# Patient Record
Sex: Female | Born: 1970 | Race: White | Hispanic: No | Marital: Married | Smoking: Never smoker
Health system: Southern US, Community
[De-identification: ages and names within clinical notes are randomized; demographics above are authoritative.]

---

## 2014-04-08 DIAGNOSIS — IMO0001 Reserved for inherently not codable concepts without codable children: Secondary | ICD-10-CM | POA: Insufficient documentation

## 2017-09-17 ENCOUNTER — Ambulatory Visit (INDEPENDENT_AMBULATORY_CARE_PROVIDER_SITE_OTHER): Payer: BC Managed Care – PPO | Admitting: Sports Medicine

## 2017-09-17 VITALS — BP 118/86 | Ht 64.0 in | Wt 154.0 lb

## 2017-09-17 DIAGNOSIS — S76011A Strain of muscle, fascia and tendon of right hip, initial encounter: Secondary | ICD-10-CM

## 2017-09-17 NOTE — Progress Notes (Signed)
   Subjective:    Patient ID: Bonnie Flores, female    DOB: [birth year], 46 y.o.   MRN: [id number]  HPI chief complaint: Right hip pain  Very pleasant 46 year old female comes in today complaining of 2 weeks of lateral right hip pain. Pain began while she was training for and running a half marathon. She had a general feeling of achiness just prior to having an acute episode of hip pain while running her half marathon. She was able to finish despite her pain. She describes a pulling sensation posterior and lateral to the greater trochanter. It is most noticeable when running up hill. It is less noticeable when running on flat surfaces or down hill. She denies groin pain. No radiating pain. No numbness or tingling. She did have a similar episode several years ago which resolved spontaneously. No prior hip surgeries. She has been using heat.  Past medical history reviewed Medications reviewed Allergies reviewed    Review of Systems    as above Objective:   Physical Exam  Well-developed, well-nourished. No acute distress. Awake alert and oriented 3. Vital signs reviewed  Right hip: Smooth painless hip range of motion with a negative logroll. She is tender to palpation at the insertion of the gluteus medius tendon. Some tenderness to palpation along the gluteus medius muscle itself. No tenderness at the piriformis. No tenderness over the greater trochanter. Negative straight leg raise. Positive Trendelenburg and noticeable hip abductor weakness when compared to the uninvolved left hip. Neurovascularly intact distally. Walking without a limp.      Assessment & Plan:   Right hip pain secondary to gluteus medius tendinopathy/tendinitis  Patient has noticeable hip weakness on exam. We will give her a home exercise program consisting of hip abductor strengthening exercises and pelvic stabilizer exercises. I think she is okay to continue training and exercising using pain as her guide. I did explain to  her that these types of injuries can take several weeks before resolving and the more she pushes through the pain the longer it will take to resolve. Fortunately, she has already completed her half marathon. Follow-up with me in 6 weeks. I did discuss the possibility of formal physical therapy if symptoms persist. Call with questions or concerns prior to her follow-up visit.

## 2018-08-14 ENCOUNTER — Encounter (INDEPENDENT_AMBULATORY_CARE_PROVIDER_SITE_OTHER): Payer: Self-pay | Admitting: Family Medicine

## 2018-08-14 ENCOUNTER — Ambulatory Visit (INDEPENDENT_AMBULATORY_CARE_PROVIDER_SITE_OTHER): Payer: Self-pay

## 2018-08-14 ENCOUNTER — Ambulatory Visit (INDEPENDENT_AMBULATORY_CARE_PROVIDER_SITE_OTHER): Payer: BC Managed Care – PPO | Admitting: Family Medicine

## 2018-08-14 DIAGNOSIS — M25572 Pain in left ankle and joints of left foot: Secondary | ICD-10-CM | POA: Diagnosis not present

## 2018-08-14 NOTE — Progress Notes (Signed)
   Office Visit Note   Patient: Bonnie Flores           Date of Birth: 08/07/1971           MRN: [id number] Visit Date: 08/14/2018 Requested by: Teena IraniSpencer, Sara C, PA-C [address] [place], [place] [id number] PCP: Lucila MaineSpencer, Sara C, PA-C  Subjective: Chief Complaint  Patient presents with  . Left Foot - Pain    Pain x 2-3 weeks.  No known injury, but has been training for a marathon.    HPI: She is a 47 year old seen at the request of Watt ClimesMike Wright, DPT, for left foot pain.  She is training for a marathon.  About 3 weeks ago during a 16 mile run, she started feeling medial foot pain at about mild 11.  She walked/jogged for the last 5 miles but continued to have pain.  She has had pain since then which has gotten progressively more constant.  She has tried taping her foot, using topical ointments, and she wears shoes with arch supports.  Her physical therapist is concerned about possible stress fracture.  She has never had one before.  She has had a history of plantar fasciitis in the past, no other foot problems.  She is otherwise in excellent health.              ROS: All other systems were negative.  Objective: Vital Signs: There were no vitals taken for this visit.  Physical Exam:  Left foot: She has bilateral normal longitudinal arches nonweightbearing but hyperpronation with weightbearing.  There is slight swelling and extreme tenderness at the left foot navicular.  She has pain with supination of the foot against resistance as well.  Tibialis posterior tendon function is intact.  No tenderness to palpation of the tibialis anterior.  No pain with flexion of the great toe against resistance.  Imaging: Three-view x-rays left foot: There is a possible osteophyte at the navicular.  I do not see a definite stress fracture.  No significant degenerative changes.  MSK-US: The tibialis posterior tendon is swollen and hypoechoic, question partial tearing near the insertion on the  navicular.   Assessment & Plan: 1.  Left foot pain, concerning for navicular stress fracture versus partial tear of the tibialis posterior tendon -MRI to further evaluate.   Follow-Up Instructions: No follow-ups on file.       Procedures: None today.   PMFS History: Patient Active Problem List   Diagnosis Date Noted  . Well adult 04/08/2014   History reviewed. No pertinent past medical history.  History reviewed. No pertinent family history.  History reviewed. No pertinent surgical history. Social History   Occupational History  . Not on file  Tobacco Use  . Smoking status: Not on file  Substance and Sexual Activity  . Alcohol use: Not on file  . Drug use: Not on file  . Sexual activity: Not on file

## 2018-08-17 ENCOUNTER — Ambulatory Visit
Admission: RE | Admit: 2018-08-17 | Discharge: 2018-08-17 | Disposition: A | Discharge status: Home / Self Care | Payer: BC Managed Care – PPO | Source: Ambulatory Visit | Attending: Family Medicine | Admitting: Family Medicine

## 2018-08-17 DIAGNOSIS — M25572 Pain in left ankle and joints of left foot: Secondary | ICD-10-CM

## 2018-08-18 ENCOUNTER — Telehealth (INDEPENDENT_AMBULATORY_CARE_PROVIDER_SITE_OTHER): Payer: Self-pay | Admitting: Family Medicine

## 2018-08-18 NOTE — Telephone Encounter (Signed)
MRI scan does not show a stress fracture, which is good news.  However, the tibialis posterior tendon is significantly inflamed and is partially torn as suspected by ultrasound imaging.  With rest and time, this will probably heal on its own.  Cortisone injection can calm down pain and inflammation, but can temporarily increased risk of rupturing the tendon so is not a good idea if she plans to continue running.  Could contemplate PRP (platelet rich plasma) injection to promote healing, but this is not covered by insurance and healing would still take 3 to 6 weeks with this.

## 2018-08-18 NOTE — Telephone Encounter (Signed)
Called and advised patient of message below per Dr. Prince RomeHilts.  Patient has an appointment for 08/19/2018 to discuss MRI.

## 2018-08-19 ENCOUNTER — Ambulatory Visit (INDEPENDENT_AMBULATORY_CARE_PROVIDER_SITE_OTHER): Payer: BC Managed Care – PPO | Admitting: Family Medicine

## 2018-08-19 ENCOUNTER — Encounter (INDEPENDENT_AMBULATORY_CARE_PROVIDER_SITE_OTHER): Payer: Self-pay | Admitting: Family Medicine

## 2018-08-19 DIAGNOSIS — M25572 Pain in left ankle and joints of left foot: Secondary | ICD-10-CM

## 2018-08-19 NOTE — Progress Notes (Signed)
   Office Visit Note   Patient: Bonnie Flores           Date of Birth: [birth year]           MRN: [id number] Visit Date: 08/19/2018 Requested by: Teena IraniSpencer, Sara C, PA-C [address] [place], [place] [id number] PCP: Lucila MaineSpencer, Sara C, PA-C  Subjective: Chief Complaint  Patient presents with  . Left Foot - Pain    HPI: She is here for follow-up left foot pain, to discuss MRI results.  Since last visit she has been resting and her pain is much better.  She is crosstraining, not running.  Her marathon is scheduled in November.              ROS: Otherwise noncontributory  Objective: Vital Signs: There were no vitals taken for this visit.  Physical Exam:  Left foot: No swelling or bruising, still tender to palpation near the navicular.  Imaging: MRI reviewed in detail showing tendinopathy/partial tearing of the tibialis posterior tendon but no sign of stress fracture.  Assessment & Plan: 1.  Left foot pain due to tibialis posterior tendinopathy and partial tearing -Discussed treatment options, she will continue crosstraining for 1 more week and then gradually resume running using pain as guide. -Referral back to physical therapy for consideration of iontophoresis. -If symptoms worsen we could contemplate a cortisone injection but we will try to avoid that for now.  PRP would be another future consideration.    Follow-Up Instructions: No follow-ups on file.       Procedures: None today.   PMFS History: Patient Active Problem List   Diagnosis Date Noted  . Well adult 04/08/2014   History reviewed. No pertinent past medical history.  History reviewed. No pertinent family history.  History reviewed. No pertinent surgical history. Social History   Occupational History  . Not on file  Tobacco Use  . Smoking status: Not on file  Substance and Sexual Activity  . Alcohol use: Not on file  . Drug use: Not on file  . Sexual activity: Not on file

## 2019-09-17 IMAGING — MR MR FOOT*L* W/O CM
4 of 5 series · 17 of 40 positions shown · non-contrast
Comparison: Radiographs dated 08/14/2018

CLINICAL DATA: Left hindfoot pain for 3 weeks starting mild
running.

EXAM:
MRI OF THE LEFT FOOT WITHOUT CONTRAST
TECHNIQUE: Multiplanar, multisequence MR imaging of the ankle was performed. No
intravenous contrast was administered.

[Series 3: T2 fat-sat · axial · 3.0mm · 0.50mm/px · z∈[-70,+33]mm · 3 of 34 slices shown (1 of 2)]
[im 4/34]
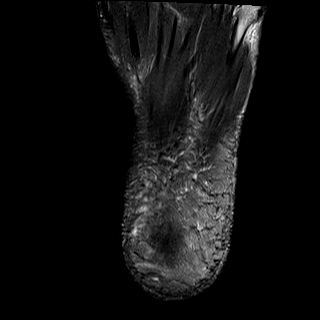
[im 19/34]
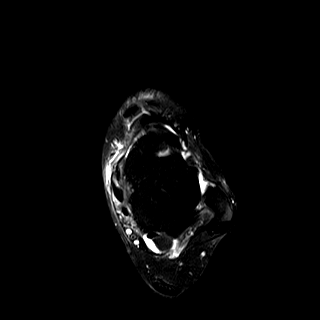
[im 30/34]
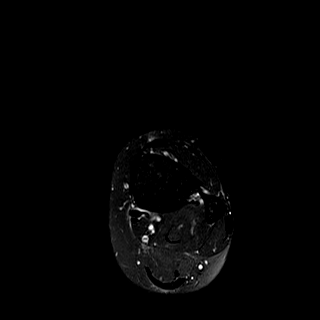

[Series 4: PD fat-sat · axial · 3.0mm · 0.31mm/px · z∈[-82,+29]mm · 8 of 34 slices shown]
[im 1/34]
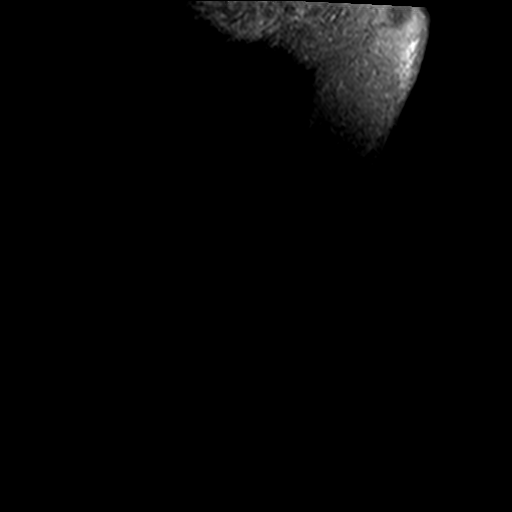
[im 5/34]
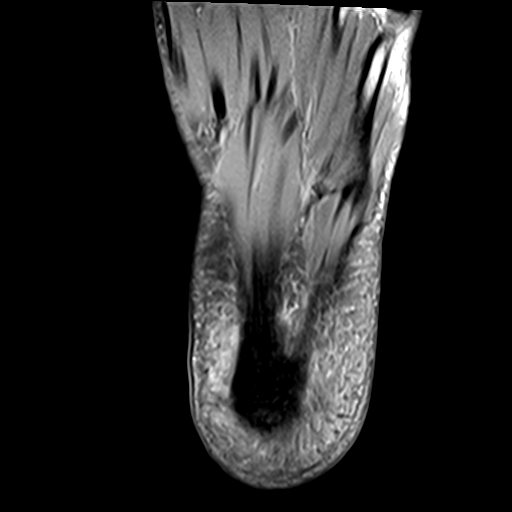
[im 9/34]
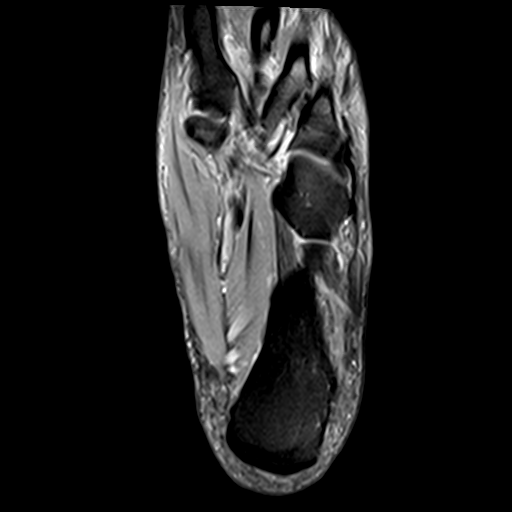
[im 13/34]
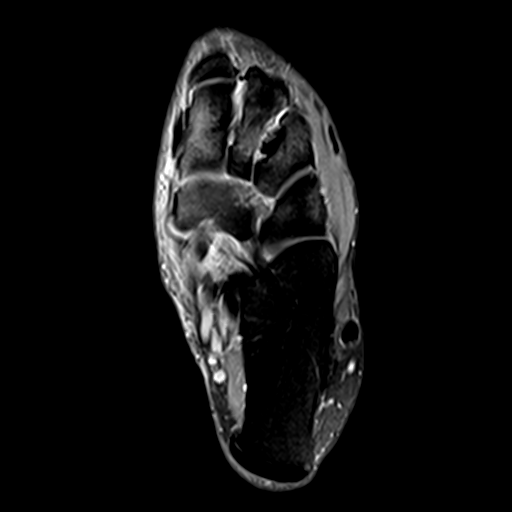
[im 17/34]
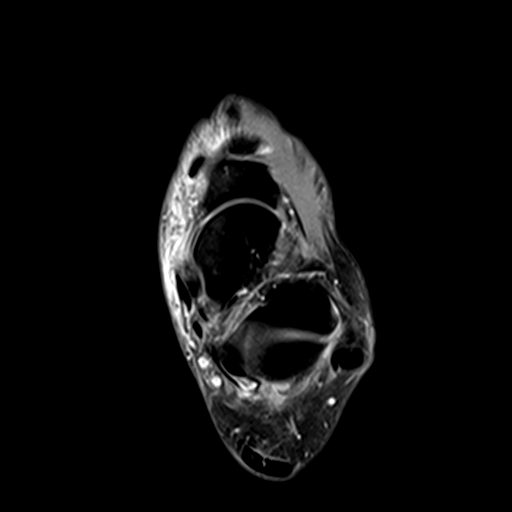
[im 21/34]
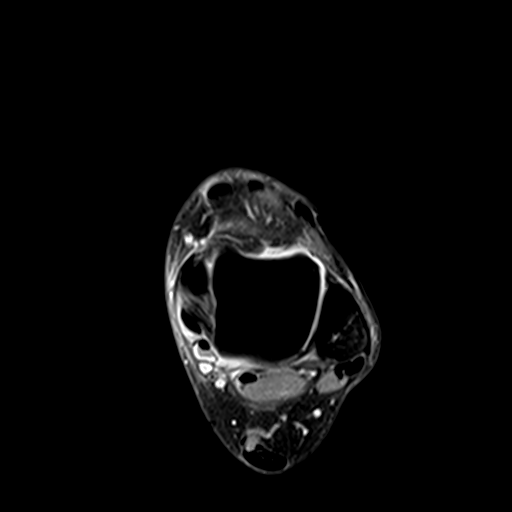
[im 25/34]
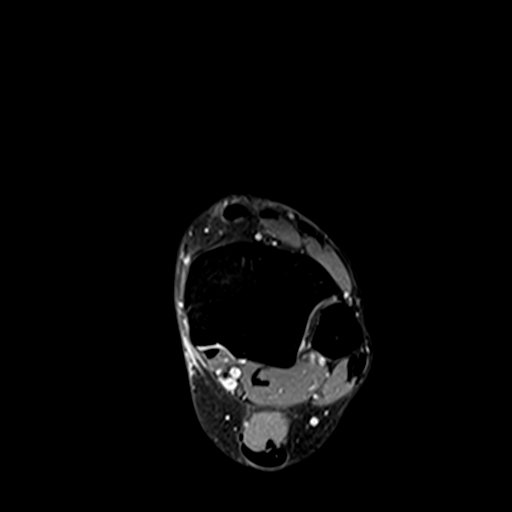
[im 29/34]
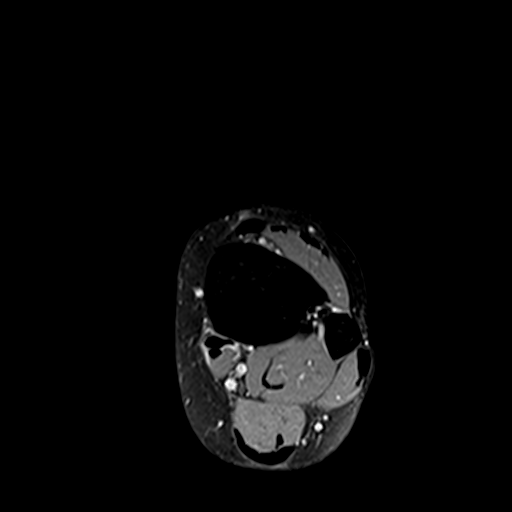

[Series 5: T1 · sagittal · 4.0mm · 0.21mm/px · 3 of 19 slices shown]
[im 1/19]
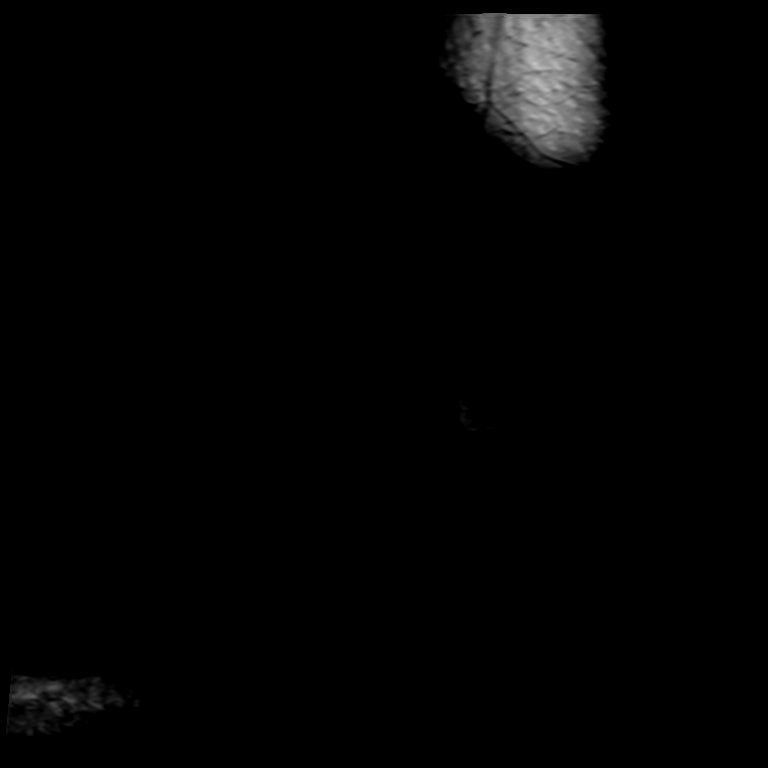
[im 10/19]
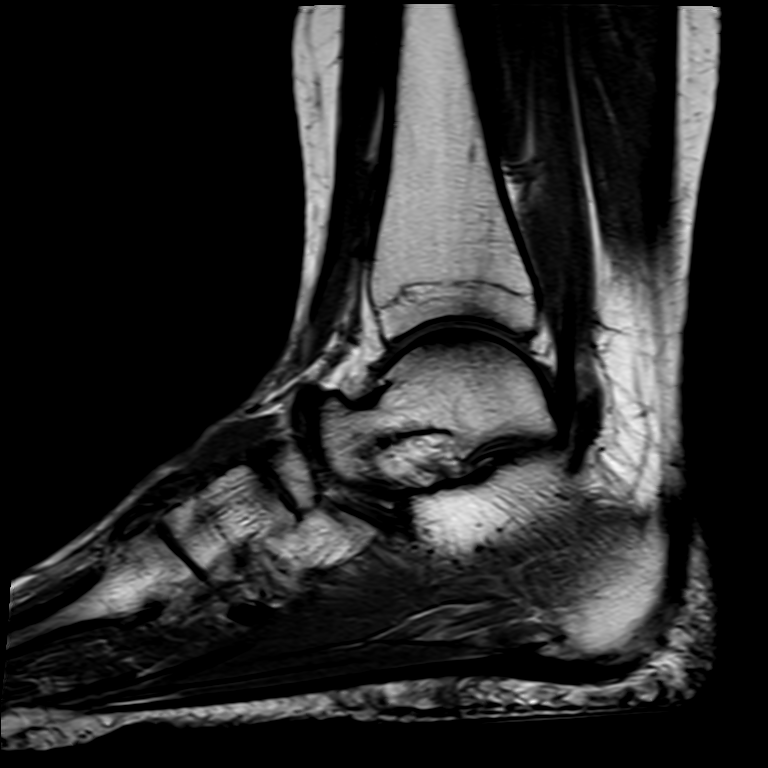
[im 19/19]
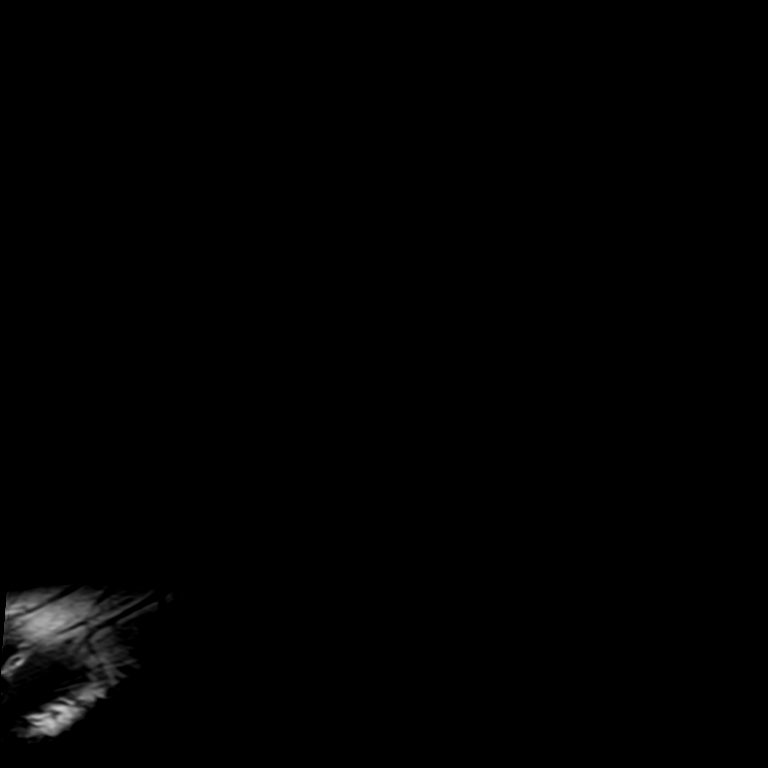

[Series 6: T2 fat-sat · coronal · 3.0mm · 0.29mm/px · 3 of 40 slices shown (2 of 2)]
[im 4/40]
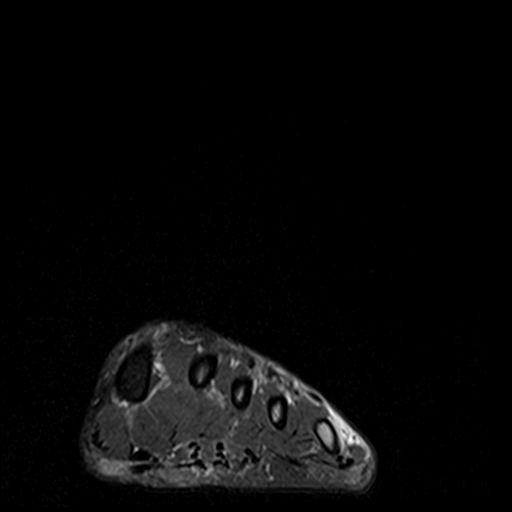
[im 20/40]
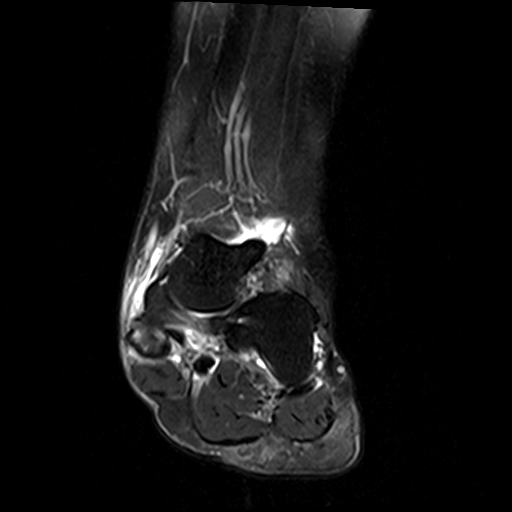
[im 36/40]
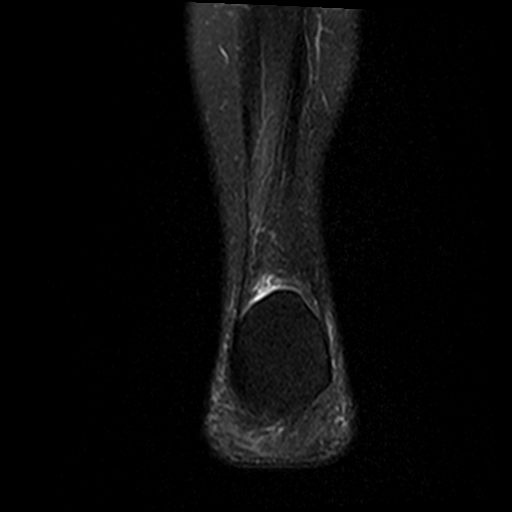

[17 of 40 positions shown; findings below may reference images not displayed]

FINDINGS: TENDONS

Peroneal: Mild peroneus longus tenosynovitis adjacent to the
peroneal tubercle.

Posteromedial: Distal tibialis posterior tenosynovitis,
peritendinitis, tendinopathy, and potentially mild partial-thickness
tearing at its insertion. Mild flexor hallucis longus and flexor
digitorum longus tenosynovitis at and proximal to the knot of Henry.

Anterior: Unremarkable

Achilles: Unremarkable

Plantar Fascia: Mildly thickened medial band of the plantar fascia.
Low-level edema tracking along the margins of a plantar calcaneal
spur.

LIGAMENTS

Lateral: Unremarkable

Medial: Low-level edema tracks along the margins of the spring
ligament, including the medioplantar oblique and inferoplantar
longitudinal components, but without a definite tear identified.

CARTILAGE

Ankle Joint: Unremarkable

Subtalar Joints/Sinus Tarsi: Unremarkable

Bones: Unremarkable

Other: No supplemental non-categorized findings.
IMPRESSION: 1. Distal tibialis posterior tendinopathy, peritendinitis, and
likely mild partial-thickness tearing at its insertion. Correlate
clinically in assessing for tibialis posterior dysfunction.
2. There is some adjacent edema along the spring ligament, but
without spring ligament discontinuity.
3. Mild tenosynovitis of the flexor digitorum longus and flexor
hallucis longus tendons.
4. Mild peroneus longus tenosynovitis adjacent to the peroneus
tubercle.
5. Mildly thickened medial band of the plantar fascia, potentially
from plantar fasciitis. Plantar calcaneal spur noted.

## 2021-01-23 ENCOUNTER — Other Ambulatory Visit: Payer: Self-pay

## 2021-01-23 ENCOUNTER — Ambulatory Visit: Payer: BC Managed Care – PPO | Admitting: Sports Medicine

## 2021-01-23 VITALS — BP 122/80 | Ht 64.0 in | Wt 160.0 lb

## 2021-01-23 DIAGNOSIS — M79671 Pain in right foot: Secondary | ICD-10-CM | POA: Diagnosis not present

## 2021-01-23 DIAGNOSIS — M76829 Posterior tibial tendinitis, unspecified leg: Secondary | ICD-10-CM

## 2021-01-23 DIAGNOSIS — M79672 Pain in left foot: Secondary | ICD-10-CM | POA: Diagnosis not present

## 2021-01-23 MED ORDER — NITROGLYCERIN 0.2 MG/HR TD PT24: MEDICATED_PATCH | TRANSDERMAL | 1 refills | Status: DC

## 2021-01-23 MED ORDER — MELOXICAM 15 MG PO TABS: ORAL_TABLET | ORAL | 0 refills | Status: DC

## 2021-01-23 NOTE — Patient Instructions (Signed)

## 2021-01-24 NOTE — Progress Notes (Signed)
° °  Subjective:    Patient ID: Bonnie Flores, female    DOB: 1971/11/18, 50 y.o.   MRN: [id number]  HPI chief complaint: Bilateral foot pain  Patient is a very pleasant 50 year old female that comes in today complaining of bilateral foot pain. She has had medial right ankle and foot pain for many months. 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 Majority of her pain is localized to the area of the navicular. Although she does not notice any swelling, she does note a feeling of tightness and fullness in her shoe as she is active. She enjoys running but has been unable to run without pain. She is able to bike and swim without discomfort. In addition to her right foot pain, she is also complaining of lateral left ankle pain for the past 2 weeks. Again, no trauma. She has not noticed any swelling. An MRI of her left foot done in 2019 showed evidence of mild partial tearing of the posterior tibialis tendon at its insertion onto the navicular as well as some tenosynovitis of the peroneus longus tendon.  Past medical history reviewed Medications reviewed Allergies reviewed     Review of Systems    As above Objective:   Physical Exam  Well-developed, well-nourished. No acute distress. Awake alert and oriented x3. Vital signs reviewed  Examination of the right ankle shows full range of motion. No effusion. No obvious soft tissue swelling. There is tenderness to palpation along the distal posterior tibialis tendon just proximal to the insertion onto the navicular. No other bony or soft tissue tenderness on palpation. Good ankle stability. Good strength. Neurovascular intact distally.  Examination of the left ankle shows full range of motion. No effusion. She is tender to palpation along the peroneal tendons along the lateral malleolus. Good strength.  Good ankle stability.  Evaluation of her feet in the standing position shows well-preserved longitudinal and transverse arches. Evaluation of her running gait does show pronation bilaterally, right greater than left.      Assessment & Plan:   Right ankle pain secondary to posterior tibialis tendon dysfunction Left ankle pain secondary to peroneal tendinitis  Had a long talk with the patient regarding treatment and outcomes. I think it would be reasonable to start a quarter patch of nitroglycerin daily at her distal posterior tibialis tendon and couple this with eccentric home exercises. For her tendinitis in the left ankle, we will do a short course of meloxicam for 7 days. I think she would benefit from custom orthotics so she will schedule an appointment for this sometime in the near future. She understands that treatment for posterior tibialis tendon dysfunction can be lengthy and she may need to temper her expectations regarding future races. However, I am optimistic that we will be able to return her to running. I have also encouraged her to continue crosstraining on a bike or in a pool.

## 2021-01-30 ENCOUNTER — Other Ambulatory Visit: Payer: Self-pay

## 2021-01-30 ENCOUNTER — Ambulatory Visit: Payer: BC Managed Care – PPO | Admitting: Sports Medicine

## 2021-01-30 VITALS — BP 120/70 | Ht 64.0 in | Wt 160.0 lb

## 2021-01-30 DIAGNOSIS — M79672 Pain in left foot: Secondary | ICD-10-CM

## 2021-01-30 DIAGNOSIS — M76829 Posterior tibial tendinitis, unspecified leg: Secondary | ICD-10-CM | POA: Diagnosis not present

## 2021-01-30 DIAGNOSIS — M79671 Pain in right foot: Secondary | ICD-10-CM

## 2021-01-31 NOTE — Progress Notes (Signed)
Patient ID: [name], female   DOB: 1971-02-27, 50 y.o.   MRN: [id number]  Patient presents today for custom orthotics.  Please see the office note from 01/23/2021 for details regarding history and physical exam findings.  Custom orthotics were created for her today.  She found them to be comfortable prior to leaving the office.  She will continue with her nitroglycerin patches and eccentric home exercises for her posterior tibialis tendinopathy.  She will follow up with me again in about 4 weeks.  Patient was fitted for a : standard, cushioned, semi-rigid orthotic. The orthotic was heated and afterward the patient stood on the orthotic blank positioned on the orthotic stand. The patient was positioned in subtalar neutral position and 10 degrees of ankle dorsiflexion in a weight bearing stance. After completion of molding, a stable base was applied to the orthotic blank. The blank was ground to a stable position for weight bearing. Size: 8 Base: Blue EVA Posting: none Additional orthotic padding: none

## 2021-02-19 ENCOUNTER — Other Ambulatory Visit: Payer: Self-pay | Admitting: Sports Medicine

## 2021-02-28 ENCOUNTER — Ambulatory Visit: Payer: BC Managed Care – PPO | Admitting: Sports Medicine

## 2021-03-14 ENCOUNTER — Other Ambulatory Visit: Payer: Self-pay

## 2021-03-14 ENCOUNTER — Ambulatory Visit (INDEPENDENT_AMBULATORY_CARE_PROVIDER_SITE_OTHER): Payer: BC Managed Care – PPO | Admitting: Sports Medicine

## 2021-03-14 DIAGNOSIS — M76829 Posterior tibial tendinitis, unspecified leg: Secondary | ICD-10-CM | POA: Insufficient documentation

## 2021-03-14 NOTE — Progress Notes (Signed)
    SUBJECTIVE:   CHIEF COMPLAINT / HPI:   F/u for posterior tibialis partial tear and peroneal tendonitis Bonnie Flores is a very pleasant 50 year old female who presents for follow-up after being treated for posterior tibialis partial insertional tear as well as peroneal tendinitis.  She has been doing the home exercise program as well as using the nitro patches and doing some walking and has had about 80% improvement in her symptoms and pain.  She still has some pain in the mornings when she first wakes up which actually gets better with activity.  She has been wearing her orthotics and says they are helping a great deal.  She also feels like the nitro patches are still helping.  She has also been continuing to cycle and crosstraining for exercise and that is also going well.  She did have 1 episode of slight pain on Friday on the medial portion of her right foot but it quickly resolved.  She is attempting to do a cycle [place] event which is over 100 miles of cycling beginning this Friday.  PERTINENT  PMH / PSH: None  OBJECTIVE:   BP (!) 132/92   Ht 5\' 4"  (1.626 m)   Wt 160 lb (72.6 kg)   BMI 27.46 kg/m   [facility] 01/23/2021  Frequency of aerobic exercise (# of days/week) 4  Average time in minutes 45  Frequency of strengthening activities (# of days/week) 0   MSK: Examination of the right foot is significant for continued tenderness to palpation at the insertion of the posterior tibialis tendon at the navicular bone.  Slight radiation along the distribution of the posterior tib tendon.  She has good ankle range of motion in plantar and dorsiflexion as well as eversion and inversion without pain.  Negative talar tilt test.  Negative anterior drawer test.  ASSESSMENT/PLAN:   Posterior tibial tendon dysfunction She continues to improve and states she has been tolerating walking her dog 2.3 miles per day we will have her start the walk run program running 5  minutes and then walking 5 minutes at 50% of the distance she was walking so around 1.1 miles.  She can slowly increase this and run time as pain tolerates.  She can continue using nitro patches but we did explain only for 5 more weeks as this would complete a 12-week course.  We also advised her that she may have a setback cycling this much over the next 3 days beginning this Friday however she should simply rest use ice and allow herself to recover.  She can follow-up with Sunday as needed.     [name], DO PGY-4, Sports Medicine Fellow [facility]  Patient seen and evaluated with the sports medicine fellow.  I agree with the above plan of care.  Patient's posterior tibialis tendinopathy continues to improve.  I recommended that she continue with her nitroglycerin patches until a total of 3 months has passed.  She will also need to incorporate her eccentric home exercises into her weekly workouts.  She is already walking a little over 2 miles a day without significant pain.  I think she can start to incorporate a run/walk program as long as she does not have returning pain.  She may continue cycling as tolerated.  Follow-up as needed.

## 2021-03-14 NOTE — Patient Instructions (Signed)
It was great to see you today! Thank you for letting me participate in your care!  Today, we discussed your improving posterior tibialis tendon pain. You can continue to use the Nitro patches for 5 more weeks. Continue to cross train as well along with the home exercises.   You may begin a walk run program. Walk 5 min and then run 5 min. Start with 50% of the distance you were walking and slowly increase the amount of running. If you have pain stop and go back to the last amount of running you could do without pain.  If you continue to improve you don't need to come back but if you have a set back please come back and see [name].  Be well, Jules Schick, DO PGY-4, Sports Medicine Fellow [facility]

## 2021-03-14 NOTE — Assessment & Plan Note (Signed)
She continues to improve and states she has been tolerating walking her dog 2.3 miles per day we will have her start the walk run program running 5 minutes and then walking 5 minutes at 50% of the distance she was walking so around 1.1 miles.  She can slowly increase this and run time as pain tolerates.  She can continue using nitro patches but we did explain only for 5 more weeks as this would complete a 12-week course.  We also advised her that she may have a setback cycling this much over the next 3 days beginning this Friday however she should simply rest use ice and allow herself to recover.  She can follow-up with [name] as needed.

## 2021-05-01 ENCOUNTER — Other Ambulatory Visit: Payer: Self-pay | Admitting: Sports Medicine

## 2021-11-16 ENCOUNTER — Ambulatory Visit: Payer: BC Managed Care – PPO | Admitting: Sports Medicine

## 2021-11-16 DIAGNOSIS — M79632 Pain in left forearm: Secondary | ICD-10-CM | POA: Diagnosis not present

## 2021-11-16 MED ORDER — MELOXICAM 15 MG PO TABS: ORAL_TABLET | ORAL | 1 refills | Status: DC

## 2021-11-16 NOTE — Progress Notes (Signed)
PCP: Teena Irani, PA-C  Subjective:   HPI: Patient is a 50 y.o. female here for left forearm pain.  Patient describes pain on the dorsal aspect of her left mid forearm. Symptoms started one month ago but have been slightly worse over the past week. Pain is now extending proximally into the elbow region. The pain is an ache majority of the time, but becomes sharp with certain movements- ie picking up a glass of water, or squeezing a squirt-gun style bottle. She cannot recall any preceding injury, trauma, or change in activity level. She does walk her dog for 1 hour every morning, but has been doing this for at least 8 months. She has tried to modify some of her activities (now walks the dog with a waist belt instead of a handheld leash) and has tried biofreeze and Advil with no improvement. Of note, patient is left hand dominant. No numbness or tingling at all.  No past medical history on file.  Current Outpatient Medications on File Prior to Visit  Medication Sig Dispense Refill   meloxicam (MOBIC) 15 MG tablet TAKE 1 TABLET DAILY WITH FOOD FOR 7 DAYS. THEN TAKE AS NEEDED. 30 tablet 1   Multiple Vitamins-Minerals (MULTIVITAMIN WITH MINERALS) tablet Take by mouth.     nitroGLYCERIN (NITRODUR - DOSED IN MG/24 HR) 0.2 mg/hr patch Use 1/4 patch daily to the affected area. 30 patch 1   No current facility-administered medications on file prior to visit.   No past surgical history on file.  Allergies  Allergen Reactions   Sulfa Antibiotics Rash    BP 110/70    Ht 5\' 4"  (1.626 m)    Wt 151 lb (68.5 kg)    BMI 25.92 kg/m   [facility] 01/23/2021  Frequency of aerobic exercise (# of days/week) 4  Average time in minutes 45  Frequency of strengthening activities (# of days/week) 0       Objective:  Physical Exam:  Gen: NAD, comfortable in exam room Left elbow and forearm: - Inspection: no obvious deformity. No swelling, erythema or bruising - Palpation: No  TTP over lateral epicondyle. Minimal tenderness over dorsal aspect of mid forearm - ROM: full active ROM in flexion and extension of wrist and elbow. Mild pain with resisted wrist flexion with elbow at full extension. Mild pain with both resisted supination and resisted pronation - Strength: 5/5 strength in wrist flexion. 5/5 wrist extension. 5/5 strength in biceps - Neuro: NV intact distally   Assessment & Plan:  1. Left Forearm Pain Patient with 1 month of dorsal left mid forearm pain. Presentation is not classic for lateral epicondylitis, given location of pain in mid forearm and there is no tenderness of the lateral epicondyle on exam. Suspect muscle strain vs inflammation from overuse of one of the extensor forearm muscles vs the supinator. No concern for radial tunnel syndrome at this time. Will treat conservatively with course of anti-inflammatory (Meloxicam x7 days) and wrist brace to minimize activity. Follow up in 3 weeks. If no improvement, consider ultrasound or additional workup to clarify etiology.  01/25/2021, MD PGY-2 [facility]   Patient seen and evaluated with the resident.  I agree with the above plan of care.  Diagnosis is not straightforward but we will treat with a period of rest with a cock up wrist brace and 7 days of meloxicam.  Follow-up in 3 weeks if symptoms persist.  Consider imaging at that time.

## 2021-11-16 NOTE — Patient Instructions (Signed)
It was great to see you!  Although it's not 100% clear what's causing your forearm pain, it seems to involve one of your forearm muscles (either one that extends the wrist or one that turns the arm). We are hopeful this will improve with a course of anti-inflammatory medication and rest.  Please take the Meloxicam once daily for the next 7 days. You should also wear the wrist brace whenever possible (but not at night).  We will see you back in 3 weeks if your symptoms have not improved.  Happy holidays, Dr Anner Crete and Dr Margaretha Sheffield

## 2021-12-07 ENCOUNTER — Ambulatory Visit: Payer: BC Managed Care – PPO | Admitting: Sports Medicine

## 2021-12-07 VITALS — BP 110/74 | Ht 64.0 in | Wt 151.0 lb

## 2021-12-07 DIAGNOSIS — M79632 Pain in left forearm: Secondary | ICD-10-CM | POA: Diagnosis not present

## 2021-12-07 NOTE — Progress Notes (Signed)
° °  Subjective:    Patient ID: Bonnie Flores, female    DOB: [birth year], 51 y.o.   MRN: [id number]  HPI  Bonnie Flores presents today for follow-up on left forearm pain.  Unfortunately, her pain has not improved.  She has not noticed any benefit from her wrist brace or her meloxicam.  She continues to localize the pain along the dorsal forearm.  It is present with pronation of the forearm or when carrying heavy objects with the forearm pronated.  She is not noticed any swelling.  She denies numbness or tingling into the forearm or hand.  No significant pain at the elbow.   Review of Systems As above    Objective:   Physical Exam  Well-developed, well-nourished.  No acute distress  Left forearm: There is tenderness to palpation diffusely along the mid dorsal forearm.  No palpable defect.  No soft tissue mass.  No soft tissue swelling.  There is reproducible pain with resisted pronation of the forearm.  No tenderness to palpation over the lateral epicondyle and pain is not reproducible with ECRB testing.  Good pulses distally.      Assessment & Plan:   Left forearm pain  Since the diagnosis is not straightforward I would like to refer her to Dr. Robina Ade for further work-up and treatment.  She is in agreement with that plan.  In the meantime, we will try a body helix compression sleeve over her forearm and she can discontinue the wrist brace as well as the meloxicam.  Follow-up with me as needed.  This note was dictated using Dragon naturally speaking software and may contain errors in syntax, spelling, or content which have not been identified prior to signing this note.

## 2021-12-14 ENCOUNTER — Other Ambulatory Visit: Payer: Self-pay

## 2021-12-14 ENCOUNTER — Encounter: Payer: Self-pay | Admitting: Orthopedic Surgery

## 2021-12-14 ENCOUNTER — Ambulatory Visit: Payer: BC Managed Care – PPO | Admitting: Orthopedic Surgery

## 2021-12-14 DIAGNOSIS — M79632 Pain in left forearm: Secondary | ICD-10-CM

## 2021-12-14 NOTE — Progress Notes (Signed)
Office Visit Note   Patient: Bonnie Flores           Date of Birth: 03/09/1971           MRN: [id number] Visit Date: 12/14/2021              Requested by: Ralene Cork, DO [id number] [place],  [place] [address] PCP: Teena Irani, PA-C   Assessment & Plan: Visit Diagnoses:  1. Left forearm pain     Plan: Discussed with patient that her proximal lateral forearm pain seems most consistent with either lateral epicondylitis or radial tunnel syndrome.  We discussed the natural course of these conditions as well as conservative and surgical treatment options.  We will start her on PO meloxicam for an extended course and hand therapy to have a custom orthosis made and to work on stretching and strengthening.  I can see her back in 6 weeks to see how she's progressing.   Follow-Up Instructions: No follow-ups on file.   Orders:  No orders of the defined types were placed in this encounter.  No orders of the defined types were placed in this encounter.     Procedures: No procedures performed   Clinical Data: No additional findings.   Subjective: Chief Complaint  Patient presents with   Left Forearm - Pain, Weakness, Numbness    Is a 51 year old left-hand-dominant female who presents with left proximal forearm pain since shortly after Thanksgiving.  She describes pain at the proximal lateral aspect of the forearm distal to lateral epicondyle.  She usually noticed her pain when walking her dog on a leash.  She has difficulty with her activities such as picking up a cup or twisting type of activities.  She has tried taking meloxicam for roughly a week.  She wore an OTC neutral wrist brace for short period time.  She has occasional numbness and paresthesias in the median nerve distribution when she is walking..  She has no pain at the medial aspect of the forearm.  Weakness Associated symptoms include weakness.   Review of Systems  Neurological:  Positive for weakness.     Objective: Vital Signs: There were no vitals taken for this visit.  Physical Exam Constitutional:      Appearance: Normal appearance.  Cardiovascular:     Rate and Rhythm: Normal rate.     Pulses: Normal pulses.  Pulmonary:     Effort: Pulmonary effort is normal.  Skin:    General: Skin is warm and dry.     Capillary Refill: Capillary refill takes less than 2 seconds.  Neurological:     Mental Status: She is alert.    Left Hand Exam   Tenderness  Left hand tenderness location: TTP at proximal lateral forearm just distal to lateral epicondyle.  Less severe TTP directly over lateral epicondyle.  No TTP at volar medial forearm around flexor-pronator.   Range of Motion  The patient has normal left wrist ROM.  Muscle Strength  The patient has normal left wrist strength.  Other  Erythema: absent Sensation: normal Pulse: present  Comments:  Negative Tinel and Phalen's signs at wrist.  Reproduction of pain w/ resisted middle finger extension with elbow extended.  Mild pain w/ resisted wrist extension with elbow extended. Lateral elbow pain w/ resisted pronation but no pain over pronator or volar medial forearm.       Specialty Comments:  No specialty comments available.  Imaging: No results found.   PMFS History:  Patient Active Problem List   Diagnosis Date Noted   Left forearm pain 11/16/2021   Posterior tibial tendon dysfunction 03/14/2021   Well adult 04/08/2014   History reviewed. No pertinent past medical history.  History reviewed. No pertinent family history.  History reviewed. No pertinent surgical history. Social History   Occupational History   Not on file  Tobacco Use   Smoking status: Not on file   Smokeless tobacco: Not on file  Substance and Sexual Activity   Alcohol use: Not on file   Drug use: Not on file   Sexual activity: Not on file

## 2021-12-26 ENCOUNTER — Encounter: Payer: Self-pay | Admitting: Occupational Therapy

## 2021-12-26 ENCOUNTER — Ambulatory Visit: Payer: BC Managed Care – PPO | Admitting: Occupational Therapy

## 2021-12-26 ENCOUNTER — Other Ambulatory Visit: Payer: Self-pay

## 2021-12-26 DIAGNOSIS — M25622 Stiffness of left elbow, not elsewhere classified: Secondary | ICD-10-CM

## 2021-12-26 DIAGNOSIS — M25522 Pain in left elbow: Secondary | ICD-10-CM | POA: Diagnosis not present

## 2021-12-26 DIAGNOSIS — M79632 Pain in left forearm: Secondary | ICD-10-CM

## 2021-12-26 DIAGNOSIS — M6281 Muscle weakness (generalized): Secondary | ICD-10-CM

## 2021-12-26 NOTE — Therapy (Signed)
OUTPATIENT OCCUPATIONAL THERAPY ORTHO EVALUATION  Patient Name: Bonnie Flores MRN: [id number] DOB:November 05, 1971, 51 y.o., female Today's Date: 12/26/2021  PCP: Lucila MaineSpencer, Sara C, PA-C REFERRING PROVIDER: Marlyne BeardsBenfield, Charlie, MD  No past medical history on file. No past surgical history on file. Patient Active Problem List   Diagnosis Date Noted   Left forearm pain 11/16/2021   Posterior tibial tendon dysfunction 03/14/2021   Well adult 04/08/2014    ONSET DATE: November 2022  REFERRING DIAG: L arm pain, weakness, numbness  THERAPY DIAG:  Non-operative treatment of radial tunnel syndrome versus lateral epicondylitis   SUBJECTIVE:  Is a [age] year old left-hand-dominant female referred by Dr Emilee HeroBenfiled with dx Left forearm pain, weakness, numbness with suspected lateral epicondylitis vs radial tunnel syndrome. She began to experience left proximal forearm pain shortly after Thanksgiving.  Her pain is at the dorsal forearm extensor muscle mass.  She notices pain with functional activity such as when walking her dog on a leash. 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 She has tried taking meloxicam for roughly a week.  She has worn an OTC neutral wrist brace in the past as well. She has occasional numbness and paresthesias in the median nerve distribution when she is walking. She presents for conservative treatment for lateral epicondylitis vs radial tunnel syndrome L UE.    PERTINENT HISTORY: Pt reports non-significant PMH.     PAIN:  Are you having pain? Yes NPRS scale: 2/10 at rest Pain location: Left dorsal forearm increased pain with activity Pain orientation: Left and Other: Dorsal forearm   PAIN TYPE: Acheing, sharp, burning in forearm around radial tunnel Pain description: intermittent, burning, tingling, and aching  Aggravating factors: Activity with forearm  pronated, pt is left HD Relieving factors: Rest, not riding her bike and not walking her dog with left hand  PRECAUTIONS: None  HAND DOMINANCE: Left  WEIGHT BEARING RESTRICTIONS No  FALLS: Has patient fallen in last 6 months? No, Number of falls: 0  PLOF: Independent  PATIENT GOALS Decreased pain and relief in her forearm, use her arm back  OBJECTIVE:   DIAGNOSTIC FINDINGS:  Pt has pain in her left dominant UE forearm extensors/dorsal arm. She denies pain at her lateral epicondyle.  COGNITION: Overall cognitive status: Within functional limits for tasks assessed  ADLs: Overall ADLs: Pt is Mod I all ADL's with left forearm pain with functional activity and forearm pronated. Difficulty reaching and grabbing things at home with left hand and elbow extended. 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  Difficulty opening jars, containers etc. Increased pain with overall functional use. Pt has a pre-fabricated splint at home that she is not currently wearing. She was instructed to bring in o her next visit for assessment to see if she should benefit from wearing it vs a custom splint. A counter-force brace was also discussed with pt. She states that she has not worn one in the past but states "I tried one on at my last visit and told him that I couldn't wear it" re:MD.   SENSATION: Light touch: Appears intact Semmes Weinstein Monofilament scale: Normal (2.36 - 2.83) digits 1-5 left hand, volarly.  COORDINATION: Comments: Difficulty opening things, jars/containers, denies dropping things or difficulty with buttons, zippers etc.   EDEMA: No, none noted.  PALPATION:  Left UE: Patient is negative for pain at proximal lateral  forearm just distal to lateral epicondyle.   She is also negative for pain with palpation over the lateral epicondyle.   She is negative for pain with palpation of her volar medial forearm around flexor-pronator.     She is Negative for  Tinel's and Phalen's signs at the wrist.     She is positive for pain at her dorsal forearm w/ resisted middle finger extension test.     She is positive for pain w/ resisted wrist extension with her elbow extended and flexed (>than MF extension test).   UE AROM/PROM:  A/PROM Right 12/26/2021 Left 12/26/2021  Shoulder flexion    Shoulder abduction    Shoulder adduction    Shoulder extension    Shoulder internal rotation    Shoulder external rotation    Elbow flexion    Elbow extension    Wrist flexion NT WNL  Wrist extension  WNL  Wrist ulnar deviation  WNL  Wrist radial deviation  WNL  Wrist pronation    Wrist supination    (Blank rows = not tested)  HAND A/PROM:  A/PROM Right 12/26/2021 Left 12/26/2021  Thumb MCP (0-60) NT WNL throughout LUE  Thumb IP (0-80)    Thumb Radial abd/add (0-55)    Thumb Palmar abd/add (0-45)    Thumb opposition to index    Index MCP (0-90)    Index PIP (0-100)    Index DIP (0-70)    Long MCP (0-90)    Long PIP (0-100)    Long DIP (0-70)    Ring MCP (0-90_    Ring PIP (0-100)    Ring DIP (0-70)    Little MCP (0-90)    Little PIP (0-100)    Little DIP (0-70)    (Blank rows = not tested)   Grip strength: Right: 69.4 lbs; Left: 41 lbs Lateral pinch: Right: 13 lbs, Left: 11 lbs 3 point pinch: Right: 11.5 lbs, Left: 9.5 lbs w/ c/o pain in dorsal forearm, extensor muscle mass Comments: Pt was positive for being "achy" after grip and pinch testing.  TODAY'S TREATMENT:  1) Pt was instructed in neutral wrist for functional activity.  2) She was instructed in home program of massage/cross friction massage to her forearm/extensors (example provided and reviewed). This should       increased circulation and assist with decreased pain/symptoms. Pt will perform 1-2x/day or as needed. 3) Pt was also instructed in forearm extensor muscle mass stretching with her elbow flexed vs extended (as tolerated), hold  for 5 seconds, repeat       5-10  times. 4) Pt was instructed to bring her pre-fabricated wrist splint into her next appointment for assessment to decide if a custom splint would be better       fitting for her. Would also consider a counter-force brace based on symptoms. 5) Pt was instructed in radial nerve gliding exercises and hand was issued, reviewed and performed in the clinic today.   PATIENT EDUCATION: Education details: See above - Home program, radial nerve gliding, extensor forearm stretching, massage. Person educated: Patient Education method: Explanation, Demonstration, Verbal cues, and Handouts Education comprehension: verbalized understanding and returned demonstration   ASSESSMENT:  CLINICAL IMPRESSION: Patient is a 51 y.o. left hand dominant female who was seen today for occupational therapy evaluation and treatment for left lateral epicondylitis vs radial tunnel syndrome. Patient has performance deficits in functional skills including ADLs, coordination, dexterity, edema, ROM, strength, pain, flexibility, and UE functional use. These impairments are limiting patient  from ADLs, work, play, and leisure. Patient has no other co-morbidities that affects occupational performance. Patient will benefit from skilled OT to address above impairments and improve overall function.  MODIFICATION OR ASSISTANCE TO COMPLETE EVALUATION: No modification of tasks or assist necessary to complete an evaluation.  OT OCCUPATIONAL PROFILE AND HISTORY: Problem focused assessment: Including review of records relating to presenting problem.  CLINICAL DECISION MAKING: LOW - limited treatment options, no task modification necessary  REHAB POTENTIAL: Good  EVALUATION COMPLEXITY: Low     GOALS: Goals reviewed with patient? No  SHORT TERM GOALS: (STG required if POC>30 days)  STG Name Target Date Goal status  1 Pt will be Mod I initial home program for left UE as observed in clinic Baseline:  01/23/2022 INITIAL  2 Pt will be  Mod I radial nerve gliding exercises left UE as observed in clinic Baseline:  01/23/2022 INITIAL  3 Pt will be Mod I massage to left UE extensor muscle mass as observed in clinic Baseline:  01/23/2022 INITIAL   LONG TERM GOALS:   LTG Name Target Date Goal status  1 Pt will be Mod I with updated HEP left UE Baseline:  02/20/2022 INITIAL  2 Pt will report decreased pain as seen by rating of 2/10 or less with simulated ADL's, functional activity and simulated work tasks Baseline:  02/20/2022 INITIAL  3 Pt will demonstrate improved LUE strength as seen by JAMAR assessment of 51# or greater (10# improvement). Baseline:  02/20/2022 INITIAL  4 Pt will report return to leisure activity such as biking or jogging with pain rating of 3/10 or less in left UE Baseline:  02/20/2022 INITIAL  5 Update and progress all goals as appropriate Baseline:     PLAN: OT FREQUENCY: 2x/week  OT DURATION: 8 weeks  PLANNED INTERVENTIONS: self care/ADL training, therapeutic exercise, therapeutic activity, manual therapy, passive range of motion, splinting, ultrasound, iontophoresis, fluidotherapy, moist heat, cryotherapy, patient/family education, and DME and/or AE instructions  PLAN FOR NEXT SESSION: Continue to assess symptoms (lateral epicondylitis vs radial tunnel syndrome) left UE.  Review home program (stretch, nerve gliding), check pre-fab splint (pt to bring into appointment) vs custom splint fabrication and consider counter-force brace. ? Consider modalities such as moist heat, iontophoresis.  RECOMMENDED OTHER SERVICES: no  CONSULTED AND AGREED WITH PLAN OF CARE: Patient   Alm Bustard, OT 12/26/2021, 7:34 AM

## 2022-01-02 ENCOUNTER — Other Ambulatory Visit: Payer: Self-pay

## 2022-01-02 ENCOUNTER — Encounter: Payer: Self-pay | Admitting: Rehabilitative and Restorative Service Providers"

## 2022-01-02 ENCOUNTER — Ambulatory Visit: Payer: BC Managed Care – PPO | Admitting: Rehabilitative and Restorative Service Providers"

## 2022-01-02 DIAGNOSIS — M25622 Stiffness of left elbow, not elsewhere classified: Secondary | ICD-10-CM

## 2022-01-02 DIAGNOSIS — M79632 Pain in left forearm: Secondary | ICD-10-CM | POA: Diagnosis not present

## 2022-01-02 DIAGNOSIS — M6281 Muscle weakness (generalized): Secondary | ICD-10-CM

## 2022-01-02 DIAGNOSIS — M25522 Pain in left elbow: Secondary | ICD-10-CM | POA: Diagnosis not present

## 2022-01-02 NOTE — Therapy (Signed)
OUTPATIENT OCCUPATIONAL THERAPY TREATMENT NOTE   Patient Name: Bonnie Flores MRN: [id number] DOB:04-21-1971, 51 y.o., female Today's Date: 01/02/2022  PCP: Selinda Orion REFERRING PROVIDER: Sherilyn Cooter, MD   OT End of Session - 01/02/22 1532     Visit Number 2    Number of Visits 16    Date for OT Re-Evaluation 01/23/22    Authorization Type [employer] PPO    Progress Note Due on Visit 10    OT Start Time 1430    OT Stop Time 1516    OT Time Calculation (min) 46 min    Equipment Utilized During Treatment pt given elbow counter-force brace (signed off by MD)    Activity Tolerance Patient tolerated treatment well    Behavior During Therapy [facility] for tasks assessed/performed             History reviewed. No pertinent past medical history. History reviewed. No pertinent surgical history. 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 She state doing HEP (radial nerve glides) and having some pain with it. Reports numbness in ULNAR nerve distribution at night and during walks with dogs early in morning.   PAIN:  Are you having pain? No, not presently at rest  NPRS scale: 0/10 Pain location: L lateral elbow Pain orientation: Left  PAIN TYPE: burning Pain description: intermittent and burning  Aggravating factors: pronated,  wrist flexed picking up objects  Relieving factors: rest, bracing, counter-force pressure    OBJECTIVE:   DIAGNOSTIC FINDINGS:  Likely lateral epicondylitis, likely no significant radial nerve issues, ulnar nerve is effected and likely related to sleeping positions, among other things.      ADLs: From eval 12/26/21 Overall ADLs: Pt is Mod I all ADL's with left forearm pain with functional activity and forearm pronated. Difficulty reaching and grabbing things at home with left hand and elbow extended. 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  Difficulty opening jars, containers etc. Increased pain with overall functional use. Pt has a pre-fabricated splint at home that she is not currently wearing. She was instructed to bring in o her next visit for assessment to see if she should benefit from wearing it vs a custom splint. A counter-force brace was also discussed with pt. She states that she has not worn one in the past but states "I tried one on at my last visit and told him that I couldn't wear it" re:MD.    SENSATION: From 1/32/23 eval: Light touch: Appears intact Semmes Weinstein Monofilament scale: Normal (2.36 - 2.83) digits 1-5 left hand, volarly.   COORDINATION: Comments: Difficulty opening things, jars/containers, denies dropping things or difficulty with buttons, zippers etc.    EDEMA: none noted.   PALPATION:  Left UE:    She is positive for pain at her dorsal forearm  w/ resisted middle finger extension test or with resisted wrist ext.  (pain lessened with counter-force applied). Positive elbow flex test (ulnar nerve) Neg Tinnels at cubital tunnel. No pain/tingling with resisted pronation      UE AROM/PROM:   A/PROM Right 12/26/2021 Left 12/26/2021  Shoulder flexion      Shoulder abduction      Shoulder adduction      Shoulder extension      Shoulder internal rotation      Shoulder external rotation      Elbow flexion      Elbow extension       Wrist flexion NT WNL  Wrist extension   WNL  Wrist ulnar deviation   WNL  Wrist radial deviation   WNL  Wrist pronation      Wrist supination      (Blank rows = not tested)   Hand Strength/Function:    From eval: Grip strength: Right: 69.4 lbs; Left: 41 lbs Lateral pinch: Right: 13 lbs, Left: 11 lbs 3 point pinch: Right: 11.5 lbs, Left: 9.5 lbs w/ c/o pain in dorsal forearm, extensor muscle mass Comments: Pt was positive for being "achy" after grip and pinch testing.   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 Epi. & positive elbow flex test). No significant radial nerve issues today, but somewhat "masked" by elbow pain. OT reviews HEP, advises to stop more aggressive radial nerve glides and stick to "waiter's tip" position. Performs slowly with pt. Also reviews wrist flex stretch with elbow bent, tolerates well. Supplies with counter-force brace (MD signed off), fits and educates for daily use in daily activities, avoiding provocative positions at home and while working, helps to identify better, fnl positions. Also edu for sleeping positions to limit elbow flexion. Additional edu on self- cross-friction massages, use of modalities for pain control.    12/26/21: 1) Pt was instructed in neutral wrist for functional activity.  2) She was instructed in home program of massage/cross friction massage to her forearm/extensors (example provided and reviewed). This should       increased circulation and assist with decreased pain/symptoms. Pt will perform 1-2x/day or as needed. 3) Pt was also instructed in forearm extensor muscle mass stretching with her elbow flexed vs extended (as tolerated), hold  for 5 seconds, repeat       5-10 times. 4) Pt was instructed to bring her pre-fabricated wrist splint into  her next appointment for assessment to decide if a custom splint would be better       fitting for her. Would also consider a counter-force brace based on symptoms. 5) Pt was instructed in radial nerve gliding exercises and hand was issued, reviewed and performed in the clinic today.    PATIENT EDUCATION: Education details: See above tx section Person educated: Patient Education method: Explanation, Demonstration, Verbal cues, and Handouts Education comprehension: verbalized understanding and returned demonstration     ASSESSMENT:   CLINICAL IMPRESSION: 01/02/22: Pt responded favorably to counter-force bracing & stretches, and likely shows lateral epicondylitis to be the main issue at lat elbow. Ulnar nerve symptoms seem strange while walking with arm hanging, but could be due to sleep postures lingering numbness into early morning walks. Will try to address ulnar nerve as well and continue to r/o radial issues.    Eval 12/26/21: Patient is  a 51 y.o. left hand dominant female who was seen today for occupational therapy evaluation and treatment for left lateral epicondylitis vs radial tunnel syndrome. Patient has performance deficits in functional skills including ADLs, coordination, dexterity, edema, ROM, strength, pain, flexibility, and UE functional use. These impairments are limiting patient from ADLs, work, play, and leisure. Patient has no other co-morbidities that affects occupational performance. Patient will benefit from skilled OT to address above impairments and improve overall function.      GOALS: Goals reviewed with patient? No   SHORT TERM GOALS: (STG required if POC>30 days)   STG Name Target Date Goal status  1 Pt will be Mod I initial home program for left UE as observed in clinic Baseline:  01/23/2022 INITIAL  2 Pt will be Mod I radial nerve gliding exercises left UE as observed in clinic Baseline:  01/23/2022 INITIAL  3 Pt will be Mod I massage to left UE extensor muscle  mass as observed in clinic Baseline:  01/23/2022 INITIAL    LONG TERM GOALS:    LTG Name Target Date Goal status  1 Pt will be Mod I with updated HEP left UE Baseline:  02/20/2022 INITIAL  2 Pt will report decreased pain as seen by rating of 2/10 or less with simulated ADL's, functional activity and simulated work tasks Baseline:  02/20/2022 INITIAL  3 Pt will demonstrate improved LUE strength as seen by JAMAR assessment of 51# or greater (10# improvement). Baseline:  02/20/2022 INITIAL  4 Pt will report return to leisure activity such as biking or jogging with pain rating of 3/10 or less in left UE Baseline:  02/20/2022 INITIAL  5 Update and progress all goals as appropriate Baseline:       PLAN: OT FREQUENCY: 2x/week   OT DURATION: 8 weeks   PLANNED INTERVENTIONS: self care/ADL training, therapeutic exercise, therapeutic activity, manual therapy, passive range of motion, splinting, ultrasound, iontophoresis, fluidotherapy, moist heat, cryotherapy, patient/family education, and DME and/or AE instructions   PLAN FOR NEXT SESSION: Check use of braces & tenderness, IASTM and heat modalities for pain relief/tissue extensibility, consider nerve tension testing, only begin light isometric strength once tolerated with no soreness.    RECOMMENDED OTHER SERVICES: no   CONSULTED AND AGREED WITH PLAN OF CARE: Patient   Benito Mccreedy, OTR/L, CHT 01/02/2022, 4:07 PM

## 2022-01-04 ENCOUNTER — Encounter: Payer: Self-pay | Admitting: Rehabilitative and Restorative Service Providers"

## 2022-01-04 ENCOUNTER — Ambulatory Visit: Payer: BC Managed Care – PPO | Admitting: Rehabilitative and Restorative Service Providers"

## 2022-01-04 ENCOUNTER — Other Ambulatory Visit: Payer: Self-pay

## 2022-01-04 DIAGNOSIS — M79632 Pain in left forearm: Secondary | ICD-10-CM | POA: Diagnosis not present

## 2022-01-04 DIAGNOSIS — M6281 Muscle weakness (generalized): Secondary | ICD-10-CM

## 2022-01-04 DIAGNOSIS — M25622 Stiffness of left elbow, not elsewhere classified: Secondary | ICD-10-CM

## 2022-01-04 DIAGNOSIS — M25522 Pain in left elbow: Secondary | ICD-10-CM

## 2022-01-04 NOTE — Therapy (Signed)
OUTPATIENT OCCUPATIONAL THERAPY TREATMENT NOTE   Patient Name: Bonnie Flores MRN: [id number] DOB:1971-10-05, 51 y.o., female Today's Date: 01/04/2022  PCP: Lucila Maine REFERRING PROVIDER: Marlyne Beards, MD   OT End of Session - 01/04/22 1422     Visit Number 3    Number of Visits 16    Date for OT Re-Evaluation 01/23/22    Authorization Type [employer] PPO    Progress Note Due on Visit 10    OT Start Time 1424    OT Stop Time 1508    OT Time Calculation (min) 44 min    Activity Tolerance Patient tolerated treatment well    Behavior During Therapy [facility] for tasks assessed/performed              History reviewed. No pertinent past medical history. History reviewed. No pertinent surgical history. 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 Yes NPRS scale: 4/10 Pain location: L lateral elbow Pain orientation: Left  PAIN TYPE: burning Pain description: intermittent and burning  Aggravating factors: pronated, wrist flexed picking up objects  Relieving factors: rest, bracing, counter-force pressure    OBJECTIVE:   DIAGNOSTIC FINDINGS:  Likely lateral epicondylitis, likely no significant radial nerve issues, ulnar nerve is effected and likely related to sleeping positions, among other things.      ADLs:  From eval 12/26/21 Overall ADLs: Pt is Mod I all ADL's with left forearm pain with functional activity and forearm pronated. Difficulty reaching and grabbing things at home with left hand and elbow extended. 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  Difficulty opening jars, containers etc. Increased pain with overall functional use. Pt has a pre-fabricated splint at home that she is not currently wearing. She was instructed to bring in o her next visit for assessment to see if she should benefit from wearing it vs a custom splint. A counter-force brace was also discussed with pt. She states that she has not worn one in the past but states "I tried one on at my last visit and told him that I couldn't wear it" re:MD.    SENSATION: From 1/32/23 eval: Light touch: Appears intact Semmes Weinstein Monofilament scale: Normal (2.36 - 2.83) digits 1-5 left hand, volarly.   COORDINATION: Comments: Difficulty opening things, jars/containers, denies dropping things or difficulty with buttons, zippers etc.    EDEMA: none noted.   PALPATION:  From 01/02/22:  Left UE: She is positive for pain at her dorsal forearm w/ resisted middle finger extension test or with resisted wrist ext.  (pain lessened with counter-force applied). Positive elbow flex test (ulnar nerve) Neg Tinnels at cubital tunnel. No pain/tingling with resisted pronation      UE AROM/PROM:   A/PROM Right 12/26/2021 Left 12/26/2021  Shoulder flexion  Shoulder abduction      Shoulder adduction      Shoulder extension      Shoulder internal rotation      Shoulder external rotation      Elbow flexion      Elbow extension      Wrist flexion NT WNL  Wrist extension   WNL  Wrist ulnar deviation   WNL  Wrist radial deviation   WNL  Wrist pronation      Wrist supination      (Blank rows = not tested)   Hand Strength/Function:    From eval: Grip strength: Right: 69.4 lbs; Left: 41 lbs Lateral pinch: Right: 13  lbs, Left: 11 lbs 3 point pinch: Right: 11.5 lbs, Left: 9.5 lbs w/ c/o pain in dorsal forearm, extensor muscle mass Comments: Pt was positive for being "achy" after grip and pinch testing.   TODAY'S TREATMENT:  01/04/22: OT starts pt with 3 mins MH concurrent with verbal HEP review. Next, OT performs manual therapy to alt elbow including IASTM mid humerus to wrist dorsally to lengthen tendons (also use small instrument to provide cross-friction), manual wrist stretches in fex, ext with mostly extended elbow today, new FA pro stretch. At the end of session also does manual ice massage over lateral epicondyle which relieves her soreness.   The below HEP was reviewed and performed including new bicep strength, wrist and FA stretches. She tolerates well with mild soreness.  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 Epi. & positive elbow flex test). No significant radial nerve issues today, but somewhat "masked" by elbow pain. OT reviews HEP, advises to stop more aggressive radial nerve glides and stick to "waiter's tip" position. Performs slowly with pt. Also reviews wrist flex stretch with elbow bent, tolerates well. Supplies with counter-force brace (MD signed off), fits and educates for daily use in daily activities, avoiding provocative positions at home and while working,  helps to identify better, fnl positions. Also edu for sleeping positions to limit elbow flexion. Additional edu on self- cross-friction massages, use of modalities for pain control.    PATIENT EDUCATION: Education details: See above tx section Person educated: Patient Education method: Explanation, Demonstration, Verbal cues, and Handouts Education comprehension: verbalized understanding and returned demonstration    HEP:  [id number]   ASSESSMENT:   CLINICAL IMPRESSION: 01/04/22: Pt still getting used to bracing in day and night, tolerating upgrade to HEP. She does continue to state a lot of activity during day at work and home, and she likely needs to be resting more or this issue could linger.    01/02/22: Pt responded favorably to counter-force bracing & stretches, and likely shows lateral epicondylitis to be the main issue at lat elbow. Ulnar nerve symptoms  seem strange while walking with arm hanging, but could be due to sleep postures lingering numbness into early morning walks. Will try to address ulnar nerve as well and continue to r/o radial issues.    Eval 12/26/21: Patient is a 51 y.o. left hand dominant female who was seen today for occupational therapy evaluation and treatment for left lateral epicondylitis vs radial tunnel syndrome. Patient has performance deficits in functional skills including ADLs, coordination, dexterity, edema, ROM, strength, pain, flexibility, and UE functional use. These impairments are limiting patient from ADLs, work, play, and leisure. Patient has no other co-morbidities that affects occupational performance. Patient will benefit from skilled OT to address above impairments and improve overall function.      GOALS: Goals reviewed with patient? No   SHORT TERM GOALS: (STG required if POC>30 days)   STG Name Target Date Goal status  1 Pt will be Mod I initial home program for left UE as observed in clinic Baseline:  01/23/2022 INITIAL  2 Pt will be Mod I  radial nerve gliding exercises left UE as observed in clinic Baseline:  01/23/2022 INITIAL  3 Pt will be Mod I massage to left UE extensor muscle mass as observed in clinic Baseline:  01/23/2022 INITIAL    LONG TERM GOALS:    LTG Name Target Date Goal status  1 Pt will be Mod I with updated HEP left UE Baseline:  02/20/2022 INITIAL  2 Pt will report decreased pain as seen by rating of 2/10 or less with simulated ADL's, functional activity and simulated work tasks Baseline:  02/20/2022 INITIAL  3 Pt will demonstrate improved LUE strength as seen by JAMAR assessment of 51# or greater (10# improvement). Baseline:  02/20/2022 INITIAL  4 Pt will report return to leisure activity such as biking or jogging with pain rating of 3/10 or less in left UE Baseline:  02/20/2022 INITIAL  5 Update and progress all goals as appropriate Baseline:       PLAN: OT FREQUENCY: 2x/week   OT DURATION: 8 weeks   PLANNED INTERVENTIONS: self care/ADL training, therapeutic exercise, therapeutic activity, manual therapy, passive range of motion, splinting, ultrasound, iontophoresis, fluidotherapy, moist heat, cryotherapy, patient/family education, and DME and/or AE instructions   PLAN FOR NEXT SESSION: Continue with manual and modalities as medically required. Encourage rest as well as HEP compliance. If new bicep strength goes well, attempt isometrics at wrist and forearm, consider tricep stretches as well.    RECOMMENDED OTHER SERVICES: no   CONSULTED AND AGREED WITH PLAN OF CARE: Patient   Fannie Knee, OTR/L, CHT 01/04/2022, 3:12 PM

## 2022-01-09 ENCOUNTER — Ambulatory Visit: Payer: BC Managed Care – PPO | Admitting: Rehabilitative and Restorative Service Providers"

## 2022-01-09 ENCOUNTER — Other Ambulatory Visit: Payer: Self-pay | Admitting: Sports Medicine

## 2022-01-09 ENCOUNTER — Other Ambulatory Visit: Payer: Self-pay

## 2022-01-09 ENCOUNTER — Encounter: Payer: Self-pay | Admitting: Rehabilitative and Restorative Service Providers"

## 2022-01-09 DIAGNOSIS — M6281 Muscle weakness (generalized): Secondary | ICD-10-CM

## 2022-01-09 DIAGNOSIS — M25522 Pain in left elbow: Secondary | ICD-10-CM | POA: Diagnosis not present

## 2022-01-09 DIAGNOSIS — M25622 Stiffness of left elbow, not elsewhere classified: Secondary | ICD-10-CM

## 2022-01-09 DIAGNOSIS — M79632 Pain in left forearm: Secondary | ICD-10-CM | POA: Diagnosis not present

## 2022-01-09 NOTE — Therapy (Signed)
OUTPATIENT OCCUPATIONAL THERAPY TREATMENT NOTE   Patient Name: Bonnie Flores MRN: [id number] DOB:07-08-1971, 51 y.o., female Today's Date: 01/09/2022  PCP: Lucila Maine REFERRING PROVIDER: Marlyne Beards, MD   OT End of Session - 01/09/22 1421     Visit Number 4    Number of Visits 16    Date for OT Re-Evaluation 01/23/22    Authorization Type [employer] PPO    Progress Note Due on Visit 10    OT Start Time 1424    OT Stop Time 1507    OT Time Calculation (min) 43 min    Activity Tolerance Patient tolerated treatment well    Behavior During Therapy [facility] for tasks assessed/performed               History reviewed. No pertinent past medical history. History reviewed. No pertinent surgical history. 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   01/04/22: Reports brace helped x1day, but today is sore, "maybe did not apply brace to just the right spot."  PAIN:  Are you having pain? Yes NPRS scale: 1/10 Pain location: L lateral elbow Pain orientation: Left  PAIN TYPE: minimal burning Pain description: intermittent and burning  Aggravating factors: pronated, wrist flexed picking up objects  Relieving factors: rest, bracing, counter-force pressure    OBJECTIVE:   DIAGNOSTIC FINDINGS:  Likely lateral  epicondylitis, likely no significant radial nerve issues, ulnar nerve is effected and likely related to sleeping positions, among other things.      ADLs: From eval 12/26/21 Overall ADLs: Pt is Mod I all ADL's with left forearm pain with functional activity and forearm pronated. Difficulty reaching and grabbing things at home with left hand and elbow extended. 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  Difficulty opening jars, containers etc. Increased pain with overall functional use. Pt has a pre-fabricated splint at home that she is not currently wearing. She was instructed to bring in o her next visit for assessment to see if she should benefit from wearing it vs a custom splint. A counter-force brace was also discussed with pt. She states that she has not worn one in the past but states "I tried one on at my last visit and told him that I couldn't wear it" re:MD.      COORDINATION: Comments: Difficulty opening things, jars/containers, denies dropping things or difficulty with buttons, zippers etc.    EDEMA: none noted.   PALPATION:  From 01/02/22:  Left UE: She is positive for pain at her dorsal forearm w/ resisted middle finger extension test or with resisted wrist ext.  (pain lessened with counter-force applied). Positive elbow flex test (ulnar nerve) Neg Tinnels at cubital tunnel. No pain/tingling with resisted pronation      UE AROM/PROM:   A/PROM Right 12/26/2021 Left  12/26/2021  Shoulder flexion      Shoulder abduction      Shoulder adduction      Shoulder extension      Shoulder internal rotation      Shoulder external rotation      Elbow flexion      Elbow extension      Wrist flexion NT WNL  Wrist extension   WNL  Wrist ulnar deviation   WNL  Wrist radial deviation   WNL  Wrist pronation      Wrist supination      (Blank rows = not tested)   Hand Strength/Function:    From eval: Grip strength: Right: 69.4 lbs; Left: 41 lbs Lateral pinch:  Right: 13 lbs, Left: 11 lbs 3 point pinch: Right: 11.5 lbs, Left: 9.5 lbs w/ c/o pain in dorsal forearm, extensor muscle mass Comments: Pt was positive for being "achy" after grip and pinch testing.   TODAY'S TREATMENT:  01/09/22: OT does manual stretches in FA pro, wrist flex, ext, then applies K-tape to help resist ext with with wrist flexion and pronation.  She performs bicep PRE with 2# and no counter force brace (CFB). New isometric strength at wrist ext and FA sup resistance, tolerated well with mild burning, 5 x each for 10 second holds, also wrist eccentric ulnar deviation with hammer to work EPB/distal extensor origin near elbow. She is encouraged intermittent stretches and icing at end. No pain after 3 mins icing. I/ADLs addressed while on ice, as she asks about swimming and other activities which are recommended against at this point.    01/04/22: OT starts pt with 3 mins MH concurrent with verbal HEP review. Next, OT performs manual therapy to alt elbow including IASTM mid humerus to wrist dorsally to lengthen tendons (also use small instrument to provide cross-friction), manual wrist stretches in fex, ext with mostly extended elbow today, new FA pro stretch. At the end of session also does manual ice massage over lateral epicondyle which relieves her soreness.  The below HEP was reviewed and performed including new bicep strength, wrist and FA stretches. She tolerates well with mild soreness.  Standing Wrist Extension Stretch - 3 x daily - 1 sets - 3-5 reps - 15-20 hold Standing Wrist Flexion Stretch - 3 x daily - 1 sets - 3-5 reps - 15-20 hold Wrist Pronation Stretch - 3 x daily - 1 sets - 3-5 reps - 15-20 hold Standing Radial Nerve Glide - 3 x daily - 1 sets - 5-10 reps - slowly hold Standing Single Arm Bicep Curls Supinated with Dumbbell - 2-3 x daily - 1-2 sets - 10 reps Seated Elbow Manual Massage Lengthwise - 3 x daily - 1 sets - 2 min hold    PATIENT EDUCATION: Education details:  See above tx section Person educated: Patient Education method: Explanation, Demonstration, Verbal cues, and Handouts Education comprehension: verbalized understanding and returned demonstration    HEP:  [id number]   ASSESSMENT:   CLINICAL IMPRESSION: 01/09/22: pt improving, tolerating new forms of light isometrics, eccentrics. Bracing at night and changing sleep habit has helped eliminate numbness during morning walks. Everything is looking good, encouraging pt to be patient and not rush at this less painful stage.   01/04/22: Pt still getting used to bracing in day and night, tolerating upgrade to HEP. She does continue to state a lot of activity during day at work and home, and she likely needs to be resting more or this issue could linger.  01/02/22: Pt responded favorably to counter-force bracing & stretches, and likely shows lateral epicondylitis to be the main issue at lat elbow. Ulnar nerve symptoms seem strange while walking with arm hanging, but could be due to sleep postures lingering numbness into early morning walks. Will try to address ulnar nerve as well and continue to r/o radial issues.    Eval 12/26/21: Patient is a 51 y.o. left hand dominant female who was seen today for occupational therapy evaluation and treatment for left lateral epicondylitis vs radial tunnel syndrome. Patient has performance deficits in functional skills including ADLs, coordination, dexterity, edema, ROM, strength, pain, flexibility, and UE functional use. These impairments are limiting patient from ADLs, work, play, and leisure. Patient has no other co-morbidities that affects occupational performance. Patient will benefit from skilled OT to address above impairments and improve overall function.      GOALS: Goals reviewed with patient? No   SHORT TERM GOALS: (STG required if POC>30 days)   STG Name Target Date Goal status  1 Pt will be Mod I initial home program for left UE as observed in  clinic Baseline:  01/23/2022 INITIAL  2 Pt will be Mod I radial nerve gliding exercises left UE as observed in clinic Baseline:  01/23/2022 INITIAL  3 Pt will be Mod I massage to left UE extensor muscle mass as observed in clinic Baseline:  01/23/2022 INITIAL    LONG TERM GOALS:    LTG Name Target Date Goal status  1 Pt will be Mod I with updated HEP left UE Baseline:  02/20/2022 INITIAL  2 Pt will report decreased pain as seen by rating of 2/10 or less with simulated ADL's, functional activity and simulated work tasks Baseline:  02/20/2022 INITIAL  3 Pt will demonstrate improved LUE strength as seen by JAMAR assessment of 51# or greater (10# improvement). Baseline:  02/20/2022 INITIAL  4 Pt will report return to leisure activity such as biking or jogging with pain rating of 3/10 or less in left UE Baseline:  02/20/2022 INITIAL  5 Update and progress all goals as appropriate Baseline:       PLAN: OT FREQUENCY: 2x/week   OT DURATION: 8 weeks   PLANNED INTERVENTIONS: self care/ADL training, therapeutic exercise, therapeutic activity, manual therapy, passive range of motion, splinting, ultrasound, iontophoresis, fluidotherapy, moist heat, cryotherapy, patient/family education, and DME and/or AE instructions   PLAN FOR NEXT SESSION: Add finger finger ext strength, upgrade FA pro/sup to hammer, and continue to upgrade PRE as tolerated but cautiously    RECOMMENDED OTHER SERVICES: no   CONSULTED AND AGREED WITH PLAN OF CARE: Patient   Fannie Knee, OTR/L, CHT 01/09/2022, 3:18 PM

## 2022-01-11 ENCOUNTER — Ambulatory Visit: Payer: BC Managed Care – PPO | Admitting: Rehabilitative and Restorative Service Providers"

## 2022-01-11 ENCOUNTER — Other Ambulatory Visit: Payer: Self-pay

## 2022-01-11 ENCOUNTER — Encounter: Payer: Self-pay | Admitting: Rehabilitative and Restorative Service Providers"

## 2022-01-11 DIAGNOSIS — M6281 Muscle weakness (generalized): Secondary | ICD-10-CM

## 2022-01-11 DIAGNOSIS — M25522 Pain in left elbow: Secondary | ICD-10-CM | POA: Diagnosis not present

## 2022-01-11 DIAGNOSIS — M79632 Pain in left forearm: Secondary | ICD-10-CM

## 2022-01-11 DIAGNOSIS — M25622 Stiffness of left elbow, not elsewhere classified: Secondary | ICD-10-CM

## 2022-01-11 NOTE — Therapy (Signed)
OUTPATIENT OCCUPATIONAL THERAPY TREATMENT NOTE   Patient Name: Bonnie Flores MRN: [id number] DOB:06-24-1971, 51 y.o., female Today's Date: 01/11/2022  PCP: Lucila Maine REFERRING PROVIDER: Marlyne Beards, MD   OT End of Session - 01/11/22 1343     Visit Number 5    Number of Visits 16    Date for OT Re-Evaluation 01/23/22    Authorization Type BCBS PPO    Progress Note Due on Visit 10    OT Start Time 1345    OT Stop Time 1432    OT Time Calculation (min) 47 min    Activity Tolerance Patient tolerated treatment well;No increased pain    Behavior During Therapy [facility] for tasks assessed/performed             History reviewed. No pertinent past medical history. History reviewed. No pertinent surgical history. 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 Didn't wear brace to run this AM and did get sore (runs pronated, straight arm- advised AGAINST).   01/09/22: She states running no longer makes her arm numb or painful, and counter-force working well, 3# weight did not cause pain.   01/04/22: Reports brace helped x1day, but today is sore, "maybe did not apply brace to just the right spot."  PAIN:  Are you having pain? No NPRS scale: 0/10 Pain location: L lateral elbow Pain orientation: Left  PAIN TYPE: minimal burning Pain description: intermittent and burning   Aggravating factors: pronated, wrist flexed picking up objects  Relieving factors: rest, bracing, counter-force pressure    OBJECTIVE: (All from Eval 12/26/21, unless specified)   DIAGNOSTIC FINDINGS:  Likely lateral epicondylitis, likely no significant radial nerve issues, ulnar nerve is effected and likely related to sleeping positions, among other things.      ADLs: From eval 12/26/21 Overall ADLs: Pt is Mod I all ADL's with left forearm pain with functional activity and forearm pronated. Difficulty reaching and grabbing things at home with left hand and elbow extended. 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  Difficulty opening jars, containers etc. Increased pain with overall functional use. Pt has a pre-fabricated splint at home that she is not currently wearing. She was instructed to bring in o her next visit for assessment to see if she should benefit from wearing it vs a custom splint. A counter-force brace was also discussed with pt. She states that she has not worn one in the past but states "I tried one on at my last visit and told him that I couldn't wear it" re:MD.      COORDINATION: Comments: Difficulty opening things, jars/containers, denies dropping things or difficulty with buttons, zippers etc.    EDEMA: none noted.   PALPATION:  From 01/02/22:  Left UE: She is positive for pain at her dorsal forearm w/ resisted middle finger extension test or with resisted wrist  ext.  (pain lessened with counter-force applied). Positive elbow flex test (ulnar nerve) Neg Tinnels at cubital tunnel. No pain/tingling with resisted pronation      UE AROM/PROM:   A/PROM Right 12/26/2021 Left 12/26/2021  Shoulder flexion      Shoulder abduction      Shoulder adduction      Shoulder extension      Shoulder internal rotation      Shoulder external rotation      Elbow flexion      Elbow extension      Wrist flexion NT WNL  Wrist extension   WNL  Wrist ulnar  deviation   WNL  Wrist radial deviation   WNL  Wrist pronation      Wrist supination      (Blank rows = not tested)   Hand Strength/Function:    From eval: Grip strength: Right: 69.4 lbs; Left: 41 lbs Lateral pinch: Right: 13 lbs, Left: 11 lbs 3 point pinch: Right: 11.5 lbs, Left: 9.5 lbs w/ c/o pain in dorsal forearm, extensor muscle mass Comments: Pt was positive for being "achy" after grip and pinch testing.   TODAY'S TREATMENT:  01/11/22: Pt tolerates active endurance on UBE at 3 resistance for 5 mins today, then performs stretches with cues/supervised. PRE is attempted to be upgraded and she also is edu and performs pro/sup PRE today with hammer. She tolerates well, performs 2 sets of: 2# b/l bicep eccentrics supinated, 1.5# b/l wrist ext, 4# b/l wrist flexion, pro/sup with hammer (8-15 reps each). She performs intermittent stretches and self-massage as well.  At end, she is mildly painful so OT performs IASTM followed by ice massage to lateral elbow, up distal humerus and down proximal forearm. She states that helps greatly (has no pain after) and requests new k-tape for support as she felt benefit from last tapping. (Tape applied resists provacative motions.)   01/09/22: OT does manual stretches in FA pro, wrist flex, ext, then applies K-tape to help resist ext with with wrist flexion and pronation.  She performs bicep PRE with 2# and no counter force brace (CFB). New isometric strength at wrist ext and FA sup resistance, tolerated well with mild burning, 5 x each for 10 second holds, also wrist eccentric ulnar deviation with hammer to work EPB/distal extensor origin near elbow. She is encouraged intermittent stretches and icing at end. No pain after 3 mins icing. I/ADLs addressed while on ice, as she asks about swimming and other activities which are recommended against at this point.    01/04/22: OT starts pt with 3 mins MH concurrent with verbal HEP review. Next, OT performs manual therapy  to alt elbow including IASTM mid humerus to wrist dorsally to lengthen tendons (also use small instrument to provide cross-friction), manual wrist stretches in fex, ext with mostly extended elbow today, new FA pro stretch. At the end of session also does manual ice massage over lateral epicondyle which relieves her soreness.  The below HEP was reviewed and performed including new bicep strength, wrist and FA stretches. She tolerates well with mild soreness.  Standing Wrist Extension Stretch - 3 x daily - 1 sets - 3-5 reps - 15-20 hold Standing Wrist Flexion Stretch - 3 x daily - 1 sets - 3-5 reps - 15-20 hold Wrist Pronation Stretch - 3 x daily - 1 sets - 3-5 reps - 15-20 hold Standing Radial Nerve Glide - 3 x daily - 1 sets - 5-10 reps - slowly hold Standing Single  Arm Bicep Curls Supinated with Dumbbell - 2-3 x daily - 1-2 sets - 10 reps Seated Elbow Manual Massage Lengthwise - 3 x daily - 1 sets - 2 min hold    PATIENT EDUCATION: Education details: See above tx section Person educated: Patient Education method: Explanation, Demonstration, Verbal cues, and Handouts Education comprehension: verbalized understanding and returned demonstration    HEP:  [id number]   ASSESSMENT:   CLINICAL IMPRESSION: 01/11/22: Improving endurance, pain, strength, etc. She will attempt 2 weeks of self-management followed by a reassessment on 01/23/22. She feels comfortable with this to test HEP and POC and determine if she can self-manage from now on.   01/09/22: pt improving, tolerating new forms of light isometrics, eccentrics. Bracing at night and changing sleep habit has helped eliminate numbness during morning walks. Everything is looking good, encouraging pt to be patient and not rush at this less painful stage.   01/04/22: Pt still getting used to bracing in day and night, tolerating upgrade to HEP. She does continue to state a lot of activity during day at work and home, and she likely needs to be resting  more or this issue could linger.       GOALS: Goals reviewed with patient? No   SHORT TERM GOALS: (STG required if POC>30 days)   STG Name Target Date Goal status  1 Pt will be Mod I initial home program for left UE as observed in clinic Baseline:  01/23/2022 INITIAL  2 Pt will be Mod I radial nerve gliding exercises left UE as observed in clinic Baseline:  01/23/2022 INITIAL  3 Pt will be Mod I massage to left UE extensor muscle mass as observed in clinic Baseline:  01/23/2022 INITIAL    LONG TERM GOALS:    LTG Name Target Date Goal status  1 Pt will be Mod I with updated HEP left UE Baseline:  02/20/2022 INITIAL  2 Pt will report decreased pain as seen by rating of 2/10 or less with simulated ADL's, functional activity and simulated work tasks Baseline:  02/20/2022 INITIAL  3 Pt will demonstrate improved LUE strength as seen by JAMAR assessment of 51# or greater (10# improvement). Baseline:  02/20/2022 INITIAL  4 Pt will report return to leisure activity such as biking or jogging with pain rating of 3/10 or less in left UE Baseline:  02/20/2022 INITIAL  5 Update and progress all goals as appropriate Baseline:       PLAN: OT FREQUENCY: 2x/week   OT DURATION: 8 weeks   PLANNED INTERVENTIONS: self care/ADL training, therapeutic exercise, therapeutic activity, manual therapy, passive range of motion, splinting, ultrasound, iontophoresis, fluidotherapy, moist heat, cryotherapy, patient/family education, and DME and/or AE instructions   PLAN FOR NEXT SESSION: Reassess on 01/23/22, check goals, pain and HEP understanding and need for continued therapy vs d/c to self-care.    RECOMMENDED OTHER SERVICES: no   CONSULTED AND AGREED WITH PLAN OF CARE: Patient   Fannie Knee, OTR/L, CHT 01/11/2022, 2:41 PM

## 2022-01-16 ENCOUNTER — Encounter: Payer: BC Managed Care – PPO | Admitting: Rehabilitative and Restorative Service Providers"

## 2022-01-18 ENCOUNTER — Encounter: Payer: BC Managed Care – PPO | Admitting: Rehabilitative and Restorative Service Providers"

## 2022-01-23 ENCOUNTER — Ambulatory Visit (INDEPENDENT_AMBULATORY_CARE_PROVIDER_SITE_OTHER): Payer: BC Managed Care – PPO | Admitting: Rehabilitative and Restorative Service Providers"

## 2022-01-23 ENCOUNTER — Other Ambulatory Visit: Payer: Self-pay

## 2022-01-23 DIAGNOSIS — M79632 Pain in left forearm: Secondary | ICD-10-CM | POA: Diagnosis not present

## 2022-01-23 DIAGNOSIS — M25522 Pain in left elbow: Secondary | ICD-10-CM

## 2022-01-23 DIAGNOSIS — M25622 Stiffness of left elbow, not elsewhere classified: Secondary | ICD-10-CM

## 2022-01-23 DIAGNOSIS — M6281 Muscle weakness (generalized): Secondary | ICD-10-CM | POA: Diagnosis not present

## 2022-01-23 NOTE — Therapy (Signed)
OUTPATIENT OCCUPATIONAL THERAPY TREATMENT & DISCHARGE NOTE   Patient Name: Bonnie Flores MRN: [id number] DOB:1971-03-19, 51 y.o., female Today's Date: 01/23/2022  PCP: Selinda Orion REFERRING PROVIDER: Aura Dials, PA-C   OT End of Session - 01/23/22 1435     Visit Number 6    Number of Visits 16    Date for OT Re-Evaluation 01/23/22    Authorization Type [employer] PPO    OT Start Time 1435    OT Stop Time 1507    OT Time Calculation (min) 32 min    Activity Tolerance Patient tolerated treatment well;No increased pain    Behavior During Therapy [facility] for tasks assessed/performed              No past medical history on file. No past surgical history on file. 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 Has been watching habits.    PAIN:  Are you having pain? No NPRS scale: 0/10 Pain location: L lateral elbow Pain orientation: Left  PAIN TYPE: minimal burning Pain description: n/a Aggravating factors: pronated, wrist flexed picking up objects  Relieving factors: rest, bracing, counter-force pressure  OBJECTIVE: (All from Eval 12/26/21, unless specified)   DIAGNOSTIC FINDINGS:  Likely lateral epicondylitis, likely no significant radial nerve issues, ulnar nerve is effected and likely related to sleeping positions, among other  things.      ADLs: From eval 12/26/21 Overall ADLs: Pt is Mod I all ADL's with left forearm pain with functional activity and forearm pronated. Difficulty reaching and grabbing things at home with left hand and elbow extended. 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  Difficulty opening jars, containers etc. Increased pain with overall functional use. Pt has a pre-fabricated splint at home that she is not currently wearing. She was instructed to bring in o her next visit for assessment to see if she should benefit from wearing it vs a custom splint. A counter-force brace was also discussed with pt. She states that she has not worn one in the past but states "I tried one on at my last visit and told him that I couldn't wear it" re:MD.      COORDINATION: Comments: Difficulty opening things, jars/containers, denies dropping things or difficulty with buttons, zippers etc.    EDEMA: none noted.   PALPATION:  From 01/02/22:  Left UE: She is positive for pain at her dorsal forearm w/ resisted middle finger extension test or with resisted wrist ext.  (pain lessened with counter-force applied). Positive elbow flex test (ulnar nerve) Neg Tinnels at cubital tunnel. No pain/tingling with resisted pronation      UE AROM/PROM:   A/PROM Right 12/26/2021 Left 12/26/2021  Shoulder flexion      Shoulder abduction      Shoulder adduction      Shoulder  extension      Shoulder internal rotation      Shoulder external rotation      Elbow flexion      Elbow extension      Wrist flexion NT WNL  Wrist extension   WNL  Wrist ulnar deviation   WNL  Wrist radial deviation   WNL  Wrist pronation      Wrist supination      (Blank rows = not tested)   Hand Strength/Function:    From eval: Grip strength: Right: 69.4 lbs; Left: 41 lbs 01/23/22: Lt hand now 53.7 Lateral pinch: Right: 13 lbs, Left: 11 lbs 3 point pinch: Right: 11.5 lbs, Left: 9.5 lbs w/ c/o pain in dorsal forearm,  extensor muscle mass Comments: Pt was positive for being "achy" after grip and pinch testing.   TODAY'S TREATMENT:  01/23/22:Pt performs A/ROM and resistive motions against therapist for reassessment and exercises.  She has at least 4+/5 MMT at wrist, hand, FA now in all planes with no pain. She also reviews stretches/HEP and how to progress on her own. OT gives additional functional ideas for swimming and biking activities. Lastly OT performs manual therapy IASTM and K-taping to lt elbow area due to her being somewhat flared up from strong provacative testing and she states benefit and support from K-tape.   01/11/22: Pt tolerates active endurance on UBE at 3 resistance for 5 mins today, then performs stretches with cues/supervised. PRE is attempted to be upgraded and she also is edu and performs pro/sup PRE today with hammer. She tolerates well, performs 2 sets of: 2# b/l bicep eccentrics supinated, 1.5# b/l wrist ext, 4# b/l wrist flexion, pro/sup with hammer (8-15 reps each). She performs intermittent stretches and self-massage as well.  At end, she is mildly painful so OT performs IASTM followed by ice massage to lateral elbow, up distal humerus and down proximal forearm. She states that helps greatly (has no pain after) and requests new k-tape for support as she felt benefit from last tapping. (Tape applied resists provacative motions.)     PATIENT EDUCATION: Education details: See above tx section Person educated: Patient Education method: Explanation, Demonstration, Verbal cues, and Handouts Education comprehension: verbalized understanding and returned demonstration    HEP:  [id number]   ASSESSMENT:   CLINICAL IMPRESSION: 01/23/22: Pt meets all goals, is very happy with progress, is tolerating HEP and progressions on her own. D/C per plan.   01/11/22: Improving endurance, pain, strength, etc. She will attempt 2 weeks of self-management followed by a reassessment on 01/23/22. She feels  comfortable with this to test HEP and POC and determine if she can self-manage from now on.     GOALS: Goals reviewed with patient? No   SHORT TERM GOALS: (STG required if POC>30 days)   STG Name Target Date Goal status  1 Pt will be Mod I initial home program for left UE as observed in clinic Baseline:  01/23/2022 MET  2 Pt will be Mod I radial nerve gliding exercises left UE as observed in clinic Baseline:  01/23/2022 MET  3 Pt will be Mod I massage to left UE extensor muscle mass as observed in clinic Baseline:  01/23/2022 MET    LONG TERM GOALS:    LTG Name Target Date Goal status  1 Pt will be Mod I with updated HEP left UE Baseline:  02/20/2022 MET  2 Pt will report decreased pain as seen by rating of 2/10 or less with simulated ADL's, functional  activity and simulated work tasks Baseline:  02/20/2022 MET  3 Pt will demonstrate improved LUE strength as seen by JAMAR assessment of 51# or greater (10# improvement). Baseline:  02/20/2022 MET 01/23/22  4 Pt will report return to leisure activity such as biking or jogging with pain rating of 3/10 or less in left UE Baseline:  02/20/2022 MET 01/23/22 no pain with jogging now   PLAN: OT FREQUENCY: 2x/week   OT DURATION: 8 weeks   PLANNED INTERVENTIONS: self care/ADL training, therapeutic exercise, therapeutic activity, manual therapy, passive range of motion, splinting, ultrasound, iontophoresis, fluidotherapy, moist heat, cryotherapy, patient/family education, and DME and/or AE instructions   PLAN FOR NEXT SESSION: N/A she is d/c now    RECOMMENDED OTHER SERVICES: no   CONSULTED AND AGREED WITH PLAN OF CARE: Patient   Benito Mccreedy, OTR/L, CHT 01/23/2022, 3:18 PM      OCCUPATIONAL THERAPY DISCHARGE SUMMARY  Visits from Start of Care: 6  Current functional level related to goals / functional outcomes: Pt has met all goals to satisfactory levels and is pleased with outcomes.   Remaining deficits: Pt has no more  significant functional deficits or pain.   Education / Equipment: Pt has all needed materials and education. Pt understands how to continue on with self-management. See tx notes for more details.   Patient agrees to discharge due to max benefits received from outpatient occupational therapy / hand therapy at this time.   Benito Mccreedy, OTR/L, CHT 01/23/22

## 2022-01-24 ENCOUNTER — Encounter: Payer: BC Managed Care – PPO | Admitting: Rehabilitative and Restorative Service Providers"

## 2022-01-25 ENCOUNTER — Encounter: Payer: BC Managed Care – PPO | Admitting: Rehabilitative and Restorative Service Providers"

## 2022-05-17 ENCOUNTER — Encounter (HOSPITAL_BASED_OUTPATIENT_CLINIC_OR_DEPARTMENT_OTHER): Payer: Self-pay | Admitting: Nurse Practitioner

## 2022-05-17 ENCOUNTER — Ambulatory Visit (HOSPITAL_BASED_OUTPATIENT_CLINIC_OR_DEPARTMENT_OTHER): Payer: BC Managed Care – PPO | Admitting: Nurse Practitioner

## 2022-05-17 VITALS — BP 122/82 | HR 76 | Ht 64.0 in | Wt 156.0 lb

## 2022-05-17 DIAGNOSIS — Z7689 Persons encountering health services in other specified circumstances: Secondary | ICD-10-CM

## 2022-05-17 DIAGNOSIS — Z1231 Encounter for screening mammogram for malignant neoplasm of breast: Secondary | ICD-10-CM | POA: Diagnosis not present

## 2022-05-17 NOTE — Progress Notes (Signed)
Tollie Eth, DNP, AGNP-c Primary Care & Sports Medicine [address]  [address] [place], [place] [address] [phone]  New patient visit   Patient: Bonnie Flores   DOB: Jan 25, 1971   51 y.o. Female  MRN: [id number] Visit Date: 05/17/2022  Patient Care Team: Ebrima Ranta, Sung Amabile, NP as PCP - General (Nurse Practitioner) Darcella Gasman, PT as Physical Therapist (Physical Therapy)  Today's Vitals   05/17/22 1458  BP: 122/82  Pulse: 76  SpO2: 96%  Weight: 156 lb (70.8 kg)  Height: 5\' 4"  (1.626 m)   Body mass index is 26.78 kg/m.   Today's healthcare provider: , NP   Chief Complaint  Patient presents with   New Patient (Initial Visit)    Patient presents today to establish care.   Subjective    Bonnie Flores is a 51 y.o. female who presents today as a new patient to establish care.    Patient endorses the following concerns presently: Nelwyn has no specific concerns today.  She is due for her mammogram and would like to have a referral for this placed. She tells me she had her Colon cancer screening completed at age 33.  Results were normal and follow-up will be at age 23. She has had her second shingles vaccine.  This was completed at [facility]. She is up-to-date on her Pap smears. Her physical exam is not due until the fall.  History reviewed and reveals the following: History reviewed. No pertinent past medical history. History reviewed. No pertinent surgical history. Family Status  Relation Name Status   Mother  Deceased   Father  Deceased   Sister  Alive   Family History  Problem Relation Age of Onset   Diabetes Father    Hypertension Father    Heart disease Father    Social History   Socioeconomic History   Marital status: Married    Spouse name: 75   Number of children: 3   Years of education: Not on file   Highest education level: Not on file  Occupational History   Not on file  Tobacco Use   Smoking status:  Never   Smokeless tobacco: Never  Vaping Use   Vaping Use: Never used  Substance and Sexual Activity   Alcohol use: Yes    Alcohol/week: 3.0 standard drinks of alcohol    Types: 3 Glasses of wine per week    Comment: social   Drug use: Never   Sexual activity: Yes    Birth control/protection: None    Comment: husband vacetomy  Other Topics Concern   Not on file  Social History Narrative   Not on file   Social Determinants of Health   Financial Resource Strain: Not on file  Food Insecurity: Not on file  Transportation Needs: Not on file  Physical Activity: Not on file  Stress: Not on file  Social Connections: Not on file   Outpatient Medications Prior to Visit  Medication Sig   Multiple Vitamins-Minerals (MULTIVITAMIN WITH MINERALS) tablet Take by mouth.   [DISCONTINUED] meloxicam (MOBIC) 15 MG tablet TAKE 1 TABLET DAILY WITH FOOD FOR 7 DAYS. THEN TAKE AS NEEDED. (Patient not taking: Reported on 05/17/2022)   [DISCONTINUED] meloxicam (MOBIC) 15 MG tablet TAKE 1 TABLET BY MOUTH EVERY DAY WITH FOOD FOR 7 DAYS THEN AS NEEDED THEREAFTER   [DISCONTINUED] nitroGLYCERIN (NITRODUR - DOSED IN MG/24 HR) 0.2 mg/hr patch Use 1/4 patch daily to the affected area. (Patient not taking: Reported on  05/17/2022)   No facility-administered medications prior to visit.   Allergies  Allergen Reactions   Sulfa Antibiotics Rash   Immunization History  Administered Date(s) Administered   Influenza Split 12/06/2012, 08/05/2017   Influenza, Seasonal, Injecte, Preservative Fre 12/06/2012, 08/22/2016, 07/16/2017, 07/07/2018   Influenza,inj,Quad PF,6+ Mos 07/07/2018, 09/21/2021   Moderna Covid-19 Vaccine Bivalent Booster 48yrs & up 08/06/2021   PFIZER Comirnaty(Gray Top)Covid-19 Tri-Sucrose Vaccine 03/13/2021   PFIZER(Purple Top)SARS-COV-2 Vaccination 01/23/2020, 02/13/2020   Tdap 04/26/2006, 04/08/2014   Zoster Recombinat (Shingrix) 03/13/2021    Review of Systems All review of systems negative  except what is listed in the HPI   Objective    BP 122/82   Pulse 76   Ht 5\' 4"  (1.626 m)   Wt 156 lb (70.8 kg)   SpO2 96%   BMI 26.78 kg/m  Physical Exam Vitals and nursing note reviewed.  Constitutional:      General: She is not in acute distress.    Appearance: Normal appearance.  Eyes:     Extraocular Movements: Extraocular movements intact.     Conjunctiva/sclera: Conjunctivae normal.     Pupils: Pupils are equal, round, and reactive to light.  Neck:     Vascular: No carotid bruit.  Cardiovascular:     Rate and Rhythm: Normal rate and regular rhythm.     Pulses: Normal pulses.     Heart sounds: Normal heart sounds. No murmur heard. Pulmonary:     Effort: Pulmonary effort is normal.     Breath sounds: Normal breath sounds. No wheezing.  Abdominal:     General: Bowel sounds are normal.     Palpations: Abdomen is soft.  Musculoskeletal:        General: Normal range of motion.     Cervical back: Normal range of motion.     Right lower leg: No edema.     Left lower leg: No edema.  Skin:    General: Skin is warm and dry.     Capillary Refill: Capillary refill takes less than 2 seconds.  Neurological:     General: No focal deficit present.     Mental Status: She is alert and oriented to person, place, and time.  Psychiatric:        Mood and Affect: Mood normal.        Behavior: Behavior normal.        Thought Content: Thought content normal.        Judgment: Judgment normal.     No results found for any visits on 05/17/22.  Assessment & Plan      Problem List Items Addressed This Visit     Encounter to establish care - Primary    New patient to establish care in the office today.  Vital signs are within normal limits.  Review of health maintenance activities with patient reveals that mammogram is due at this time.  We will place order for this for patient. Up-to-date on colonoscopy, shingles vaccine, and Pap smears.  Her next physical exam is due in the fall.    We will need to obtain records from her previous provider to update her health maintenance. Review of medical history completed and diet and exercise recommendations provided on AVS. We will plan for follow-up in the fall for her well woman exam or sooner if needed.      Relevant Orders   MM 3D SCREEN BREAST BILATERAL   Other Visit Diagnoses     Encounter for screening mammogram for malignant  neoplasm of breast       Relevant Orders   MM 3D SCREEN BREAST BILATERAL        Return for After September 12th for CPE.      Barbee Mamula, Sung Amabile, NP, DNP, AGNP-C Primary Care & Sports Medicine at [facility]

## 2022-05-17 NOTE — Patient Instructions (Addendum)
Thank you for choosing [place] at [facility] for your Primary Care needs. I am excited for the opportunity to partner with you to meet your health care goals. It was a pleasure meeting you today!  Recommendations from today's visit: Everything looks great today! I have sent in for your mammogram- the breast center will call you to schedule this in the near future.  We can plan for your physical exam sometime in September. If you have any concerns sooner, please let me know.   Information on diet, exercise, and health maintenance recommendations are listed below. This is information to help you be sure you are on track for optimal health and monitoring.   Please look over this and let us know if you have any questions or if you have completed any of the health maintenance outside of [place] so that we can be sure your records are up to date.  ___________________________________________________________ About Me: I am an Adult-Geriatric Nurse Practitioner with a background in caring for patients for more than 20 years with a strong intensive care background. I provide primary care and sports medicine services to patients age 46 and older within this office. My education had a strong focus on caring for the older adult population, which I am passionate about. I am also the director of the APP Fellowship with [facility].   My desire is to provide you with the best service through preventive medicine and supportive care. I consider you a part of the medical team and value your input. I work diligently to ensure that you are heard and your needs are met in a safe and effective manner. I want you to feel comfortable with me as your provider and want you to know that your health concerns are important to me.  For your information, our office hours are: Monday, Tuesday, and Thursday 8:00 AM - 5:00 PM Wednesday and Friday 8:00 AM - 12:00 PM.   In my time away from the office I  am teaching new APP's within the system and am unavailable, but my partner, Dr. Burnard Bunting is in the office for emergent needs.   If you have questions or concerns, please call our office at [phone] or send [name] a MyChart message and we will respond as quickly as possible.  ____________________________________________________________ MyChart:  For all urgent or time sensitive needs we ask that you please call the office to avoid delays. Our number is [phone]. MyChart is not constantly monitored and due to the large volume of messages a day, replies may take up to 72 business hours.  MyChart Policy: MyChart allows for you to see your visit notes, after visit summary, provider recommendations, lab and tests results, make an appointment, request refills, and contact your provider or the office for non-urgent questions or concerns. Providers are seeing patients during normal business hours and do not have built in time to review MyChart messages.  We ask that you allow a minimum of 3 business days for responses to [employer]. For this reason, please do not send urgent requests through [place]. Please call the office at [phone]. New and ongoing conditions may require a visit. We have virtual and in person visit available for your convenience.  Complex MyChart concerns may require a visit. Your provider may request you schedule a virtual or in person visit to ensure we are providing the best care possible. MyChart messages sent after 11:00 AM on Friday will not be received by the provider until  Monday morning.    Lab and Test Results: You will receive your lab and test results on MyChart as soon as they are completed and results have been sent by the lab or testing facility. Due to this service, you will receive your results BEFORE your provider.  I review lab and tests results each morning prior to seeing patients. Some results require collaboration with other providers to ensure you are  receiving the most appropriate care. For this reason, we ask that you please allow a minimum of 3-5 business days from the time the ALL results have been received for your provider to receive and review lab and test results and contact you about these.  Most lab and test result comments from the provider will be sent through [place]. Your provider may recommend changes to the plan of care, follow-up visits, repeat testing, ask questions, or request an office visit to discuss these results. You may reply directly to this message or call the office at [phone] to provide information for the provider or set up an appointment. In some instances, you will be called with test results and recommendations. Please let us know if this is preferred and we will make note of this in your chart to provide this for you.    If you have not heard a response to your lab or test results in 5 business days from all results returning to [place], please call the office to let us know. We ask that you please avoid calling prior to this time unless there is an emergent concern. Due to high call volumes, this can delay the resulting process.  After Hours: For all non-emergency after hours needs, please call the office at [phone] and select the option to reach the on-call provider service. On-call services are shared between multiple [place] offices and therefore it will not be possible to speak directly with your provider. On-call providers may provide medical advice and recommendations, but are unable to provide refills for maintenance medications.  For all emergency or urgent medical needs after normal business hours, we recommend that you seek care at the closest Urgent Care or Emergency Department to ensure appropriate treatment in a timely manner.  [facility] [place] has a 24 hour emergency room located on the ground floor for your convenience.   Urgent Concerns During the Business Day Providers  are seeing patients from 8AM to [place] with a busy schedule and are most often not able to respond to non-urgent calls until the end of the day or the next business day. If you should have URGENT concerns during the day, please call and speak to the nurse or schedule a same day appointment so that we can address your concern without delay.   Thank you, again, for choosing me as your health care partner. I appreciate your trust and look forward to learning more about you.   Worthy Keeler, DNP, AGNP-c ___________________________________________________________  Health Maintenance Recommendations Screening Testing Mammogram Every 1 -2 years based on history and risk factors Starting at age 34 Pap Smear Ages 21-39 every 3 years Ages 84-65 every 5 years with HPV testing More frequent testing may be required based on results and history Colon Cancer Screening Every 1-10 years based on test performed, risk factors, and history Starting at age 24 Bone Density Screening Every 2-10 years based on history Starting at age 69 for women Recommendations for men differ based on medication usage, history, and risk factors AAA Screening One time ultrasound Men  39-12 years old who have every smoked Lung Cancer Screening Low Dose Lung CT every 12 months Age 40-80 years with a 30 pack-year smoking history who still smoke or who have quit within the last 15 years  Screening Labs Routine  Labs: Complete Blood Count (CBC), Complete Metabolic Panel (CMP), Cholesterol (Lipid Panel) Every 6-12 months based on history and medications May be recommended more frequently based on current conditions or previous results Hemoglobin A1c Lab Every 3-12 months based on history and previous results Starting at age 76 or earlier with diagnosis of diabetes, high cholesterol, BMI >26, and/or risk factors Frequent monitoring for patients with diabetes to ensure blood sugar control Thyroid Panel (TSH w/ T3 & T4) Every 6  months based on history, symptoms, and risk factors May be repeated more often if on medication HIV One time testing for all patients 11 and older May be repeated more frequently for patients with increased risk factors or exposure Hepatitis C One time testing for all patients 35 and older May be repeated more frequently for patients with increased risk factors or exposure Gonorrhea, Chlamydia Every 12 months for all sexually active persons 13-24 years Additional monitoring may be recommended for those who are considered high risk or who have symptoms PSA Men 39-22 years old with risk factors Additional screening may be recommended from age 66-69 based on risk factors, symptoms, and history  Vaccine Recommendations Tetanus Booster All adults every 10 years Flu Vaccine All patients 6 months and older every year COVID Vaccine All patients 12 years and older Initial dosing with booster May recommend additional booster based on age and health history HPV Vaccine 2 doses all patients age 38-26 Dosing may be considered for patients over 26 Shingles Vaccine (Shingrix) 2 doses all adults 77 years and older Pneumonia (Pneumovax 23) All adults 14 years and older May recommend earlier dosing based on health history Pneumonia (Prevnar 50) All adults 75 years and older Dosed 1 year after Pneumovax 23  Additional Screening, Testing, and Vaccinations may be recommended on an individualized basis based on family history, health history, risk factors, and/or exposure.  __________________________________________________________  Diet Recommendations for All Patients  I recommend that all patients maintain a diet low in saturated fats, carbohydrates, and cholesterol. While this can be challenging at first, it is not impossible and small changes can make big differences.  Things to try: Decreasing the amount of soda, sweet tea, and/or juice to one or less per day and replace with water While  water is always the first choice, if you do not like water you may consider adding a water additive without sugar to improve the taste other sugar free drinks Replace potatoes with a brightly colored vegetable at dinner Use healthy oils, such as canola oil or olive oil, instead of butter or hard margarine Limit your bread intake to two pieces or less a day Replace regular pasta with low carb pasta options Bake, broil, or grill foods instead of frying Monitor portion sizes  Eat smaller, more frequent meals throughout the day instead of large meals  An important thing to remember is, if you love foods that are not great for your health, you don't have to give them up completely. Instead, allow these foods to be a reward when you have done well. Allowing yourself to still have special treats every once in a while is a nice way to tell yourself thank you for working hard to keep yourself healthy.   Also remember that every  day is a new day. If you have a bad day and "fall off the wagon", you can still climb right back up and keep moving along on your journey!  We have resources available to help you!  Some websites that may be helpful include: www.http://carter.biz/  Www.VeryWellFit.com _____________________________________________________________  Activity Recommendations for All Patients  I recommend that all adults get at least 20 minutes of moderate physical activity that elevates your heart rate at least 5 days out of the week.  Some examples include: Walking or jogging at a pace that allows you to carry on a conversation Cycling (stationary bike or outdoors) Water aerobics Yoga Weight lifting Dancing If physical limitations prevent you from putting stress on your joints, exercise in a pool or seated in a chair are excellent options.  Do determine your MAXIMUM heart rate for activity: YOUR AGE - 220 = MAX HeartRate   Remember! Do not push yourself too hard.  Start slowly and build up  your pace, speed, weight, time in exercise, etc.  Allow your body to rest between exercise and get good sleep. You will need more water than normal when you are exerting yourself. Do not wait until you are thirsty to drink. Drink with a purpose of getting in at least 8, 8 ounce glasses of water a day plus more depending on how much you exercise and sweat.    If you begin to develop dizziness, chest pain, abdominal pain, jaw pain, shortness of breath, headache, vision changes, lightheadedness, or other concerning symptoms, stop the activity and allow your body to rest. If your symptoms are severe, seek emergency evaluation immediately. If your symptoms are concerning, but not severe, please let us know so that we can recommend further evaluation.

## 2022-05-30 DIAGNOSIS — Z7689 Persons encountering health services in other specified circumstances: Secondary | ICD-10-CM | POA: Insufficient documentation

## 2022-05-30 NOTE — Assessment & Plan Note (Signed)
New patient to establish care in the office today.  Vital signs are within normal limits.  Review of health maintenance activities with patient reveals that mammogram is due at this time.  We will place order for this for patient. Up-to-date on colonoscopy, shingles vaccine, and Pap smears.  Her next physical exam is due in the fall.   We will need to obtain records from her previous provider to update her health maintenance. Review of medical history completed and diet and exercise recommendations provided on AVS. We will plan for follow-up in the fall for her well woman exam or sooner if needed.

## 2022-06-18 ENCOUNTER — Ambulatory Visit
Admission: RE | Admit: 2022-06-18 | Discharge: 2022-06-18 | Disposition: A | Discharge status: Home / Self Care | Payer: BC Managed Care – PPO | Source: Ambulatory Visit | Attending: Nurse Practitioner | Admitting: Nurse Practitioner

## 2022-06-18 DIAGNOSIS — Z1231 Encounter for screening mammogram for malignant neoplasm of breast: Secondary | ICD-10-CM

## 2022-06-18 DIAGNOSIS — Z7689 Persons encountering health services in other specified circumstances: Secondary | ICD-10-CM

## 2022-08-07 ENCOUNTER — Ambulatory Visit: Payer: BC Managed Care – PPO | Admitting: Sports Medicine

## 2022-08-07 VITALS — BP 124/80 | Ht 64.0 in | Wt 155.0 lb

## 2022-08-07 DIAGNOSIS — M25561 Pain in right knee: Secondary | ICD-10-CM | POA: Diagnosis not present

## 2022-08-07 NOTE — Progress Notes (Signed)
PCP: Tollie Eth, NP  Subjective:   HPI: Patient is a 51 y.o. female here for right knee pain.  Patient has had occasional right knee pain over the past 1 year. It has become more noticeable over the past 3 months and particularly bothersome for the last 3-4 weeks. The pain is located in her anterior knee and described as sharp. It's only present when going up stairs or when biking. She is able to walk and run without pain. No pain at rest. No clicking, popping, or cracking that she has noticed. No preceding injury or trauma.  No past medical history on file.  Current Outpatient Medications on File Prior to Visit  Medication Sig Dispense Refill   Multiple Vitamins-Minerals (MULTIVITAMIN WITH MINERALS) tablet Take by mouth.     No current facility-administered medications on file prior to visit.    No past surgical history on file.  Allergies  Allergen Reactions   Sulfa Antibiotics Rash    BP 124/80   Ht 5\' 4"  (1.626 m)   Wt 155 lb (70.3 kg)   BMI 26.61 kg/m      01/23/2021    3:58 PM 11/16/2021   11:15 AM 12/07/2021    8:30 AM  [facility] Adult Exercise  Frequency of aerobic exercise (# of days/week) 4 7 7   Average time in minutes 45 45 45  Frequency of strengthening activities (# of days/week) 0 0 0        No data to display              Objective:  Physical Exam:  Gen: NAD, comfortable in exam room Knee, right: Inspection was negative for erythema, ecchymosis, and effusion. No obvious bony abnormalities or signs of osteophyte development. Palpation yielded no asymmetric warmth; No joint line tenderness; No condyle tenderness; No patellar tenderness; No knee crepitus. Patellar and quadriceps tendons unremarkable, and no tenderness of the pes anserine bursa. No obvious Baker's cyst development. ROM normal in flexion (135 degrees) and extension (0 degrees). Strength 5/5 with knee flexion and extension. Neurovascularly intact  bilaterally.   Provocative Testing:  - Patella:   - Patellar glide: Appropriate medial/lateral glide without apprehension - Cruciate Ligaments:   - Anterior Drawer/Lachman test: NEG - Posterior Drawer: NEG  - Collateral Ligaments:   - Varus/Valgus (MCL/LCL) Stress test at 0, 15d: NEG  - Meniscus:   - Thessaly: NEG   - McMurray's: NEG    Assessment & Plan:  1. Right knee pain: elicited mainly by climbing stairs. Etiology is not entirely clear but unlikely to be ligamentous or meniscal injury given her ability to run without pain and no appreciable abnormality on physical exam or ultrasound. 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 Patient to return next week for new orthotics to see if this provides any improvement.  Limited ultrasound performed in the office. No effusion noted in the suprapatellar pouch. Patellar tendon unremarkable. Small spur along medial aspect of the knee joint  02/04/2022, MD PGY-3, [facility]  Patient seen and evaluated with the resident.  I agree with the above plan of care.  Ultrasound today shows some slight spurring along the medial joint space but otherwise unremarkable.  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  She is otherwise able to exercise without pain.  I reassured her that  she may continue to exercise using pain as her guide.  I also think that new custom orthotics may be helpful.  She will return to the office at her convenience for those orthotics to be constructed.  This note was dictated using Dragon naturally speaking software and may contain errors in syntax, spelling, or content which have not been identified prior to signing this note.

## 2022-08-14 ENCOUNTER — Ambulatory Visit (INDEPENDENT_AMBULATORY_CARE_PROVIDER_SITE_OTHER): Payer: BC Managed Care – PPO | Admitting: Sports Medicine

## 2022-08-14 ENCOUNTER — Ambulatory Visit
Admission: RE | Admit: 2022-08-14 | Discharge: 2022-08-14 | Disposition: A | Discharge status: Home / Self Care | Payer: BC Managed Care – PPO | Source: Ambulatory Visit | Attending: Sports Medicine | Admitting: Sports Medicine

## 2022-08-14 VITALS — BP 110/70 | Ht 64.0 in | Wt 156.0 lb

## 2022-08-14 DIAGNOSIS — M25572 Pain in left ankle and joints of left foot: Secondary | ICD-10-CM | POA: Diagnosis not present

## 2022-08-14 DIAGNOSIS — M25561 Pain in right knee: Secondary | ICD-10-CM

## 2022-08-15 ENCOUNTER — Encounter: Payer: Self-pay | Admitting: Sports Medicine

## 2022-08-15 NOTE — Progress Notes (Signed)
   Subjective:    Patient ID: Bonnie Flores, female    DOB: July 18, 1971, 51 y.o.   MRN: [id number]  HPI  Bonnie Flores presents today for custom orthotics.  Please see the office note from August 07, 2022 for details regarding history and physical exam findings.  Since that visit, she suffered an inversion injury to her left ankle while running.  She is now having pain across the dorsum of her foot.  Pain is most noticeable with pushoff, especially when barefoot.  She is more comfortable in a tennis shoe with a stiffer sole.  She denies pain at the ankle.  Review of Systems As above    Objective:   Physical Exam  Well-developed, well-nourished.  No acute distress  Left ankle: Full range of motion.  No effusion.  Good stability to anterior drawer and talar tilt testing. Left foot: There is some tenderness to palpation across the dorsum of the foot along the second and third metatarsal shafts.  Very mild soft tissue swelling here as well.  No other bony tenderness to direct palpation.  Good pulses.  Walking with a slight limp.  X-rays of the left foot including AP, lateral, and oblique view show no obvious fracture.  Please note that the official radiology report is still pending at the time of this dictation.      Assessment & Plan:   Acute left ankle sprain with dorsal foot pain Right knee pain History of posterior tibialis tendon dysfunction  Custom orthotics were created as below.  She found them to be comfortable prior to leaving the office.  Hopefully the cushion and the new orthotics will help her right knee pain.  Reassurance regarding her x-rays but I do want her to wear a stiff soled shoe until follow-up with me again in 2 weeks.  No running in the meantime.  If pain persists at follow-up then we will do a bedside ultrasound and may need to consider repeat x-rays.  Patient was fitted for a : standard, cushioned, semi-rigid orthotic. The orthotic was heated and afterward the patient stood on  the orthotic blank positioned on the orthotic stand. The patient was positioned in subtalar neutral position and 10 degrees of ankle dorsiflexion in a weight bearing stance. After completion of molding, a stable base was applied to the orthotic blank. The blank was ground to a stable position for weight bearing. Size: 8 Base: Blue EVA Posting: None Additional orthotic padding: None

## 2022-08-16 ENCOUNTER — Encounter (HOSPITAL_BASED_OUTPATIENT_CLINIC_OR_DEPARTMENT_OTHER): Payer: BC Managed Care – PPO | Admitting: Nurse Practitioner

## 2022-08-28 ENCOUNTER — Ambulatory Visit: Payer: BC Managed Care – PPO | Admitting: Sports Medicine

## 2022-08-28 VITALS — BP 120/70 | Ht 64.0 in

## 2022-08-28 DIAGNOSIS — M25561 Pain in right knee: Secondary | ICD-10-CM

## 2022-08-28 NOTE — Progress Notes (Signed)
   Subjective:    Patient ID: Bonnie Flores, female    DOB: 10-11-1971, 51 y.o.   MRN: [id number]  HPI  Bonnie Flores is here to follow-up on a couple of different issues.  She has found her new custom orthotics to be very comfortable.  She still has some left foot pain status post inversion injury to the left ankle.  X-rays showed no obvious fracture.  Her pain is worse when she is barefoot.  Otherwise, she is quite comfortable in a tennis shoe.  It has not interfered with her ability to exercise.  She is also following up on her right knee pain.  Pain continues to be present only when walking up steep stairs.  It is not present at any other time.  No swelling.  X-rays done previously did show some mild patellofemoral DJD.    Review of Systems As above    Objective:   Physical Exam  Well-developed, well-nourished.  No acute distress  Right knee: Full range of motion.  No effusion.  No tenderness to palpation.  Good ligament stability.  Neurovascularly intact distally.        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  Symptoms are not bad enough now to warrant MRI or cortisone injection.  She would like to start with a home exercise program before considering formal physical therapy.  We will give her some VMO strengthening exercises to work on at home.  Since her symptoms are isolated to only walking up steep stairs she will try to avoid this is much as possible although her home and work environment make this impossible to avoid altogether.  Otherwise, I will see her back on an as-needed basis.  This note was dictated using Dragon naturally speaking software and may contain errors in syntax, spelling, or content which have not been identified prior to signing this note.

## 2022-12-04 ENCOUNTER — Encounter: Payer: Self-pay | Admitting: Internal Medicine

## 2024-10-23 ENCOUNTER — Institutional Professional Consult (permissible substitution): Admitting: Plastic Surgery

## 2025-01-01 ENCOUNTER — Ambulatory Visit: Admitting: Plastic Surgery

## 2025-01-01 ENCOUNTER — Encounter: Payer: Self-pay | Admitting: Plastic Surgery

## 2025-01-01 VITALS — BP 125/86 | HR 101 | Ht 64.0 in | Wt 175.0 lb

## 2025-01-01 DIAGNOSIS — H02839 Dermatochalasis of unspecified eye, unspecified eyelid: Secondary | ICD-10-CM | POA: Insufficient documentation

## 2025-01-01 DIAGNOSIS — H02831 Dermatochalasis of right upper eyelid: Secondary | ICD-10-CM

## 2025-01-01 NOTE — Progress Notes (Signed)
" ° °    Patient ID: Bonnie Flores, female    DOB: 30-Jan-1971, 54 y.o.   MRN: [id number]   Chief Complaint  Patient presents with   Skin Problem    The patient is a 54 year old female here for consult [place] for her upper lids.  The patient has noticed over the years and increasing difficulty with her visual field.  It is worse in the evenings.  She finds herself actually lifting her brow on the left side with her finger to see fully.  She does wear glasses and sees the eye doctor regularly.  She does not have any other major health issues.  Nothing seems to improve it and it is getting worse or in time.  Her brows upper lid skin is resting on her eyelashes.  The upper eyelids are hitting at the pupil.    Review of Systems  Constitutional: Negative.   HENT: Negative.    Eyes: Negative.   Respiratory: Negative.    Cardiovascular: Negative.   Gastrointestinal: Negative.   Endocrine: Negative.   Genitourinary: Negative.   Musculoskeletal: Negative.     History reviewed. No pertinent past medical history.  History reviewed. No pertinent surgical history.   Current Medications[1]   Objective:   There were no vitals filed for this visit.  Physical Exam Constitutional:      Appearance: Normal appearance.  HENT:     Head: Atraumatic.  Cardiovascular:     Rate and Rhythm: Normal rate.     Pulses: Normal pulses.  Pulmonary:     Effort: Pulmonary effort is normal.  Abdominal:     Palpations: Abdomen is soft.  Musculoskeletal:        General: No tenderness.  Skin:    General: Skin is warm.     Capillary Refill: Capillary refill takes less than 2 seconds.     Coloration: Skin is not jaundiced.     Findings: No bruising.  Neurological:     Mental Status: She is alert and oriented to person, place, and time.  Psychiatric:        Mood and Affect: Mood normal.        Behavior: Behavior normal.        Thought Content: Thought content normal.        Judgment: Judgment normal.      Assessment & Plan:  Dermatochalasis of both upper eyelids  The patient is a good candidate for bilateral upper lid blepharoplasty.  We will refer her for a visual field exam and plan to do a 1 month video appointment to make sure she has gotten that and to evaluate the results.  So we will plan to talk in 1 month.  Pictures were obtained of the patient and placed in the chart with the patient's or guardian's permission.   Estefana RAMAN Letha Mirabal, DO    [1]  Current Outpatient Medications:    Multiple Vitamins-Minerals (MULTIVITAMIN WITH MINERALS) tablet, Take by mouth., Disp: , Rfl:   "

## 2025-02-09 ENCOUNTER — Telehealth: Admitting: Plastic Surgery
